# Patient Record
Sex: Male | Born: 1970 | Hispanic: Yes
Health system: Southern US, Community
[De-identification: ages and names within clinical notes are randomized; demographics above are authoritative.]

---

## 2021-02-18 ENCOUNTER — Emergency Department (HOSPITAL_COMMUNITY)
Admission: EM | Admit: 2021-02-18 | Discharge: 2021-02-19 | Discharge status: Against Medical Advice | Payer: BLUE CROSS/BLUE SHIELD | Attending: Emergency Medicine | Admitting: Emergency Medicine

## 2021-02-18 DIAGNOSIS — Z79899 Other long term (current) drug therapy: Secondary | ICD-10-CM | POA: Diagnosis not present

## 2021-02-18 DIAGNOSIS — R531 Weakness: Secondary | ICD-10-CM | POA: Insufficient documentation

## 2021-02-18 DIAGNOSIS — Y908 Blood alcohol level of 240 mg/100 ml or more: Secondary | ICD-10-CM | POA: Diagnosis not present

## 2021-02-18 DIAGNOSIS — F1010 Alcohol abuse, uncomplicated: Secondary | ICD-10-CM | POA: Insufficient documentation

## 2021-02-18 MED ORDER — SODIUM CHLORIDE 0.9 % IV BOLUS
1000.0000 mL | Freq: Once | INTRAVENOUS | Status: AC
  Administered 2021-02-19: 1000 mL via INTRAVENOUS
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
## 2021-02-18 NOTE — ED Notes (Signed)
This RN attempted to obtain height and weight on pt. Pt states he  Is 7'5" and 315lb.

## 2021-02-19 ENCOUNTER — Encounter (HOSPITAL_COMMUNITY): Payer: Self-pay | Admitting: Emergency Medicine

## 2021-02-19 LAB — COMPREHENSIVE METABOLIC PANEL
ALT: 33 U/L (ref 0–44)
AST: 106 U/L — ABNORMAL HIGH (ref 15–41)
Albumin: 2.1 g/dL — ABNORMAL LOW (ref 3.5–5.0)
Alkaline Phosphatase: 209 U/L — ABNORMAL HIGH (ref 38–126)
Anion gap: 12 (ref 5–15)
BUN: 5 mg/dL — ABNORMAL LOW (ref 6–20)
CO2: 22 mmol/L (ref 22–32)
Calcium: 7.7 mg/dL — ABNORMAL LOW (ref 8.9–10.3)
Chloride: 99 mmol/L (ref 98–111)
Creatinine, Ser: 0.75 mg/dL (ref 0.61–1.24)
GFR, Estimated: >60 mL/min (ref >60)
Glucose, Bld: 425 mg/dL — ABNORMAL HIGH (ref 70–99)
Potassium: 3.2 mmol/L — ABNORMAL LOW (ref 3.5–5.1)
Sodium: 133 mmol/L — ABNORMAL LOW (ref 135–145)
Total Bilirubin: 2 mg/dL — ABNORMAL HIGH (ref 0.3–1.2)
Total Protein: 7.7 g/dL (ref 6.5–8.1)

## 2021-02-19 LAB — CBC WITH DIFFERENTIAL/PLATELET
Abs Immature Granulocytes: 0.01 10*3/uL (ref 0.00–0.07)
Basophils Absolute: 0.1 10*3/uL (ref 0.0–0.1)
Basophils Relative: 3 %
Eosinophils Absolute: 0.2 10*3/uL (ref 0.0–0.5)
Eosinophils Relative: 5 %
HCT: 22.4 % — ABNORMAL LOW (ref 39.0–52.0)
Hemoglobin: 7.2 g/dL — ABNORMAL LOW (ref 13.0–17.0)
Immature Granulocytes: 0 %
Lymphocytes Relative: 24 %
Lymphs Abs: 1 10*3/uL (ref 0.7–4.0)
MCH: 27.5 pg (ref 26.0–34.0)
MCHC: 32.1 g/dL (ref 30.0–36.0)
MCV: 85.5 fL (ref 80.0–100.0)
Monocytes Absolute: 0.3 10*3/uL (ref 0.1–1.0)
Monocytes Relative: 8 %
Neutro Abs: 2.6 10*3/uL (ref 1.7–7.7)
Neutrophils Relative %: 60 %
Platelets: 18 10*3/uL — CL (ref 150–400)
RBC: 2.62 MIL/uL — ABNORMAL LOW (ref 4.22–5.81)
RDW: 18.6 % — ABNORMAL HIGH (ref 11.5–15.5)
Smear Review: DECREASED
WBC: 4.2 10*3/uL (ref 4.0–10.5)
nRBC: 0.5 % — ABNORMAL HIGH (ref 0.0–0.2)

## 2021-02-19 LAB — AMMONIA: Ammonia: 103 umol/L — ABNORMAL HIGH (ref 9–35)

## 2021-02-19 LAB — ETHANOL: Alcohol, Ethyl (B): 423 mg/dL (ref <10)

## 2021-02-19 NOTE — ED Notes (Signed)
Pt continuously stating he is leaving. 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 Dr Adela Lank made aware. IV taken out. Pt chaperoned down hallway to exit. Pt refused last set of vitals and signing AMA paperwork.

## 2021-02-19 NOTE — ED Provider Notes (Addendum)
[facility] EMERGENCY DEPARTMENT Provider Note   CSN: [id number] Arrival date & time: 02/18/21  2301     History Chief Complaint  Patient presents with   Weakness    Jeffrey Sullivan is a 50 y.o. male.  50 yo M with a chief complaints of left-sided weakness.  Tells me this been going on for a few days.  States that it is made him fall multiple times.  He feels like his left leg is much more weak than the other.  He denies head injury denies loss of consciousness denies chest pain.  Denies cough congestion or fever denies nausea vomiting or diarrhea.  The history is provided by the patient.  Weakness Severity:  Moderate Onset quality:  Gradual Duration:  3 days Timing:  Constant Progression:  Worsening Chronicity:  New Context: alcohol use   Relieved by:  Nothing Worsened by:  Nothing Ineffective treatments:  None tried Associated symptoms: no abdominal pain, no arthralgias, no chest pain, no diarrhea, no fever, no headaches, no myalgias, no shortness of breath and no vomiting       History reviewed. No pertinent past medical history.  There are no problems to display for this patient.   History reviewed. No pertinent surgical history.     No family history on file.     Home Medications Prior to Admission medications   Not on File    Allergies    Patient has no allergy information on record.  Review of Systems   Review of Systems  Constitutional:  Negative for chills and fever.  HENT:  Negative for congestion and facial swelling.   Eyes:  Negative for discharge and visual disturbance.  Respiratory:  Negative for shortness of breath.   Cardiovascular:  Negative for chest pain and palpitations.  Gastrointestinal:  Negative for abdominal pain, diarrhea and vomiting.  Musculoskeletal:  Negative for arthralgias and myalgias.  Skin:  Negative for color change and rash.  Neurological:  Positive for weakness. Negative for tremors, syncope and  headaches.  Psychiatric/Behavioral:  Negative for confusion and dysphoric mood.    Physical Exam Updated Vital Signs BP 105/72 (BP Location: Right Arm)   Pulse (!) 114   Temp 98.7 F (37.1 C) (Oral)   Resp 16   SpO2 99%   Physical Exam Vitals and nursing note reviewed.  Constitutional:      Appearance: He is well-developed.  HENT:     Head: Normocephalic and atraumatic.  Eyes:     Pupils: Pupils are equal, round, and reactive to light.  Neck:     Vascular: No JVD.  Cardiovascular:     Rate and Rhythm: Normal rate and regular rhythm.     Heart sounds: No murmur heard.   No friction rub. No gallop.  Pulmonary:     Effort: No respiratory distress.     Breath sounds: No wheezing.  Abdominal:     General: There is no distension.     Tenderness: There is no abdominal tenderness. There is no guarding or rebound.  Musculoskeletal:        General: Normal range of motion.     Cervical back: Normal range of motion and neck supple.  Skin:    Coloration: Skin is not pale.     Findings: No rash.  Neurological:     Mental Status: He is alert.     Comments: Positive EtOH on breath.  Patient does appear to have some weakness in the left side though may be  secondary to effort.  Intoxicated difficult neurologic exam.  Psychiatric:        Behavior: Behavior normal.    ED Results / Procedures / Treatments   Labs (all labs ordered are listed, but only abnormal results are displayed) Labs Reviewed  CBC WITH DIFFERENTIAL/PLATELET - Abnormal; Notable for the following components:      Result Value   RBC 2.62 (*)    Hemoglobin 7.2 (*)    HCT 22.4 (*)    RDW 18.6 (*)    Platelets 18 (*)    nRBC 0.5 (*)    All other components within normal limits  COMPREHENSIVE METABOLIC PANEL - Abnormal; Notable for the following components:   Sodium 133 (*)    Potassium 3.2 (*)    Glucose, Bld 425 (*)    BUN 5 (*)    Calcium 7.7 (*)    Albumin 2.1 (*)    AST 106 (*)    Alkaline Phosphatase  209 (*)    Total Bilirubin 2.0 (*)    All other components within normal limits  ETHANOL - Abnormal; Notable for the following components:   Alcohol, Ethyl (B) 423 (*)    All other components within normal limits  AMMONIA - Abnormal; Notable for the following components:   Ammonia 103 (*)    All other components within normal limits    EKG None  Radiology No results found.  Procedures Procedures   Medications Ordered in ED Medications  sodium chloride 0.9 % bolus 1,000 mL (0 mLs Intravenous Stopped 02/19/21 0141)    ED Course  I have reviewed the triage vital signs and the nursing notes.  Pertinent labs & imaging results that were available during my care of the patient were reviewed by me and considered in my medical decision making (see chart for details).    MDM Rules/Calculators/A&P                           50 yo M with a chief complaints of frequent falls and left-sided weakness going on for about 3 days now.  Patient is intoxicated and so is difficult to get a great history from him.  Will obtain a CT of the head blood work reassess.  The patient became tired of waiting.  He is able to ambulate under his own volition.  No obvious signs of leg weakness on ambulation.  Encouraged him to stay to complete the work-up the patient is declining.  Patient is able to ambulate's and make decisions when asked questions.  Do not feel that I can involuntarily hold him at this time.  Offered to return at any time.  The patients results and plan were reviewed and discussed.   Any x-rays performed were independently reviewed by myself.   Differential diagnosis were considered with the presenting HPI.  Medications  sodium chloride 0.9 % bolus 1,000 mL (0 mLs Intravenous Stopped 02/19/21 0141)    Vitals:   02/18/21 2321  BP: 105/72  Pulse: (!) 114  Resp: 16  Temp: 98.7 F (37.1 C)  TempSrc: Oral  SpO2: 99%    Final diagnoses:  Weakness    Final Clinical Impression(s)  / ED Diagnoses Final diagnoses:  Weakness    Rx / DC Orders ED Discharge Orders     None        Melene Plan, DO 02/19/21 0442    Melene Plan, DO 02/19/21 [phone]

## 2021-02-19 NOTE — ED Notes (Signed)
Pt is repeatedly stating he wants to leave, saying he doesn't want to wait any longer to be treated. Convinced to stay at this time

## 2021-08-30 ENCOUNTER — Other Ambulatory Visit: Payer: Self-pay

## 2021-08-30 ENCOUNTER — Emergency Department (HOSPITAL_COMMUNITY): Payer: Medicaid Other

## 2021-08-30 ENCOUNTER — Inpatient Hospital Stay (HOSPITAL_COMMUNITY)
Admission: EM | Admit: 2021-08-30 | Discharge: 2021-09-22 | DRG: 441 | Disposition: E | Payer: Medicaid Other | Attending: Pulmonary Disease | Admitting: Pulmonary Disease

## 2021-08-30 DIAGNOSIS — R188 Other ascites: Secondary | ICD-10-CM

## 2021-08-30 DIAGNOSIS — Z66 Do not resuscitate: Secondary | ICD-10-CM | POA: Diagnosis not present

## 2021-08-30 DIAGNOSIS — G9341 Metabolic encephalopathy: Secondary | ICD-10-CM | POA: Diagnosis present

## 2021-08-30 DIAGNOSIS — K567 Ileus, unspecified: Secondary | ICD-10-CM

## 2021-08-30 DIAGNOSIS — K7031 Alcoholic cirrhosis of liver with ascites: Secondary | ICD-10-CM | POA: Diagnosis present

## 2021-08-30 DIAGNOSIS — E8809 Other disorders of plasma-protein metabolism, not elsewhere classified: Secondary | ICD-10-CM

## 2021-08-30 DIAGNOSIS — Y908 Blood alcohol level of 240 mg/100 ml or more: Secondary | ICD-10-CM | POA: Diagnosis present

## 2021-08-30 DIAGNOSIS — J690 Pneumonitis due to inhalation of food and vomit: Secondary | ICD-10-CM | POA: Diagnosis not present

## 2021-08-30 DIAGNOSIS — J90 Pleural effusion, not elsewhere classified: Secondary | ICD-10-CM | POA: Diagnosis present

## 2021-08-30 DIAGNOSIS — K7460 Unspecified cirrhosis of liver: Secondary | ICD-10-CM

## 2021-08-30 DIAGNOSIS — E1165 Type 2 diabetes mellitus with hyperglycemia: Secondary | ICD-10-CM | POA: Diagnosis present

## 2021-08-30 DIAGNOSIS — F1510 Other stimulant abuse, uncomplicated: Secondary | ICD-10-CM | POA: Diagnosis present

## 2021-08-30 DIAGNOSIS — Z4659 Encounter for fitting and adjustment of other gastrointestinal appliance and device: Secondary | ICD-10-CM

## 2021-08-30 DIAGNOSIS — F10229 Alcohol dependence with intoxication, unspecified: Secondary | ICD-10-CM

## 2021-08-30 DIAGNOSIS — Z978 Presence of other specified devices: Secondary | ICD-10-CM

## 2021-08-30 DIAGNOSIS — R4182 Altered mental status, unspecified: Secondary | ICD-10-CM

## 2021-08-30 DIAGNOSIS — Z515 Encounter for palliative care: Secondary | ICD-10-CM

## 2021-08-30 DIAGNOSIS — R578 Other shock: Secondary | ICD-10-CM

## 2021-08-30 DIAGNOSIS — R791 Abnormal coagulation profile: Secondary | ICD-10-CM

## 2021-08-30 DIAGNOSIS — R34 Anuria and oliguria: Secondary | ICD-10-CM | POA: Diagnosis present

## 2021-08-30 DIAGNOSIS — R680 Hypothermia, not associated with low environmental temperature: Secondary | ICD-10-CM | POA: Diagnosis present

## 2021-08-30 DIAGNOSIS — K7682 Hepatic encephalopathy: Secondary | ICD-10-CM | POA: Diagnosis present

## 2021-08-30 DIAGNOSIS — R310 Gross hematuria: Secondary | ICD-10-CM | POA: Diagnosis present

## 2021-08-30 DIAGNOSIS — K7011 Alcoholic hepatitis with ascites: Secondary | ICD-10-CM | POA: Diagnosis present

## 2021-08-30 DIAGNOSIS — E8729 Other acidosis: Secondary | ICD-10-CM | POA: Diagnosis present

## 2021-08-30 DIAGNOSIS — D696 Thrombocytopenia, unspecified: Secondary | ICD-10-CM

## 2021-08-30 DIAGNOSIS — Z20822 Contact with and (suspected) exposure to covid-19: Secondary | ICD-10-CM | POA: Diagnosis present

## 2021-08-30 DIAGNOSIS — E876 Hypokalemia: Secondary | ICD-10-CM

## 2021-08-30 DIAGNOSIS — N179 Acute kidney failure, unspecified: Secondary | ICD-10-CM | POA: Diagnosis present

## 2021-08-30 DIAGNOSIS — G9340 Encephalopathy, unspecified: Secondary | ICD-10-CM

## 2021-08-30 DIAGNOSIS — E871 Hypo-osmolality and hyponatremia: Secondary | ICD-10-CM | POA: Diagnosis present

## 2021-08-30 DIAGNOSIS — J9601 Acute respiratory failure with hypoxia: Secondary | ICD-10-CM

## 2021-08-30 DIAGNOSIS — D62 Acute posthemorrhagic anemia: Secondary | ICD-10-CM | POA: Diagnosis present

## 2021-08-30 DIAGNOSIS — D689 Coagulation defect, unspecified: Secondary | ICD-10-CM | POA: Diagnosis present

## 2021-08-30 DIAGNOSIS — K7010 Alcoholic hepatitis without ascites: Secondary | ICD-10-CM

## 2021-08-30 DIAGNOSIS — E870 Hyperosmolality and hypernatremia: Secondary | ICD-10-CM

## 2021-08-30 DIAGNOSIS — Z452 Encounter for adjustment and management of vascular access device: Secondary | ICD-10-CM

## 2021-08-30 DIAGNOSIS — Z781 Physical restraint status: Secondary | ICD-10-CM

## 2021-08-30 DIAGNOSIS — Z9289 Personal history of other medical treatment: Secondary | ICD-10-CM

## 2021-08-30 DIAGNOSIS — E119 Type 2 diabetes mellitus without complications: Secondary | ICD-10-CM

## 2021-08-30 DIAGNOSIS — K3189 Other diseases of stomach and duodenum: Secondary | ICD-10-CM

## 2021-08-30 DIAGNOSIS — T50901A Poisoning by unspecified drugs, medicaments and biological substances, accidental (unintentional), initial encounter: Secondary | ICD-10-CM | POA: Diagnosis present

## 2021-08-30 DIAGNOSIS — F1010 Alcohol abuse, uncomplicated: Secondary | ICD-10-CM

## 2021-08-30 DIAGNOSIS — D6959 Other secondary thrombocytopenia: Secondary | ICD-10-CM | POA: Diagnosis present

## 2021-08-30 DIAGNOSIS — K922 Gastrointestinal hemorrhage, unspecified: Secondary | ICD-10-CM

## 2021-08-30 DIAGNOSIS — J9602 Acute respiratory failure with hypercapnia: Secondary | ICD-10-CM | POA: Diagnosis present

## 2021-08-30 DIAGNOSIS — E440 Moderate protein-calorie malnutrition: Secondary | ICD-10-CM | POA: Insufficient documentation

## 2021-08-30 DIAGNOSIS — R319 Hematuria, unspecified: Secondary | ICD-10-CM

## 2021-08-30 LAB — URINALYSIS, ROUTINE W REFLEX MICROSCOPIC
Bilirubin Urine: NEGATIVE
Glucose, UA: 50 mg/dL — AB
Ketones, ur: NEGATIVE mg/dL
Leukocytes,Ua: NEGATIVE
Nitrite: NEGATIVE
Protein, ur: 100 mg/dL — AB
RBC / HPF: >50 RBC/hpf — ABNORMAL HIGH (ref 0–5)
Specific Gravity, Urine: 1.011 (ref 1.005–1.030)
pH: 5 (ref 5.0–8.0)

## 2021-08-30 LAB — I-STAT CHEM 8, ED
BUN: 12 mg/dL (ref 6–20)
Calcium, Ion: 0.92 mmol/L — ABNORMAL LOW (ref 1.15–1.40)
Chloride: 98 mmol/L (ref 98–111)
Creatinine, Ser: 1.3 mg/dL — ABNORMAL HIGH (ref 0.61–1.24)
Glucose, Bld: 255 mg/dL — ABNORMAL HIGH (ref 70–99)
HCT: 32 % — ABNORMAL LOW (ref 39.0–52.0)
Hemoglobin: 10.9 g/dL — ABNORMAL LOW (ref 13.0–17.0)
Potassium: 3.4 mmol/L — ABNORMAL LOW (ref 3.5–5.1)
Sodium: 134 mmol/L — ABNORMAL LOW (ref 135–145)
TCO2: 22 mmol/L (ref 22–32)

## 2021-08-30 LAB — COMPREHENSIVE METABOLIC PANEL
ALT: 35 U/L (ref 0–44)
AST: 89 U/L — ABNORMAL HIGH (ref 15–41)
Albumin: 2.1 g/dL — ABNORMAL LOW (ref 3.5–5.0)
Alkaline Phosphatase: 248 U/L — ABNORMAL HIGH (ref 38–126)
Anion gap: 15 (ref 5–15)
BUN: 12 mg/dL (ref 6–20)
CO2: 17 mmol/L — ABNORMAL LOW (ref 22–32)
Calcium: 7.4 mg/dL — ABNORMAL LOW (ref 8.9–10.3)
Chloride: 98 mmol/L (ref 98–111)
Creatinine, Ser: 1.14 mg/dL (ref 0.61–1.24)
GFR, Estimated: >60 mL/min (ref >60)
Glucose, Bld: 265 mg/dL — ABNORMAL HIGH (ref 70–99)
Potassium: 3.4 mmol/L — ABNORMAL LOW (ref 3.5–5.1)
Sodium: 130 mmol/L — ABNORMAL LOW (ref 135–145)
Total Bilirubin: 2.2 mg/dL — ABNORMAL HIGH (ref 0.3–1.2)
Total Protein: 7.4 g/dL (ref 6.5–8.1)

## 2021-08-30 LAB — CK: Total CK: 431 U/L — ABNORMAL HIGH (ref 49–397)

## 2021-08-30 LAB — I-STAT ARTERIAL BLOOD GAS, ED
Acid-base deficit: 4 mmol/L — ABNORMAL HIGH (ref 0.0–2.0)
Bicarbonate: 22.1 mmol/L (ref 20.0–28.0)
Calcium, Ion: 1.02 mmol/L — ABNORMAL LOW (ref 1.15–1.40)
HCT: 28 % — ABNORMAL LOW (ref 39.0–52.0)
Hemoglobin: 9.5 g/dL — ABNORMAL LOW (ref 13.0–17.0)
O2 Saturation: 100 %
Patient temperature: 99.5
Potassium: 3.1 mmol/L — ABNORMAL LOW (ref 3.5–5.1)
Sodium: 137 mmol/L (ref 135–145)
TCO2: 23 mmol/L (ref 22–32)
pCO2 arterial: 44.6 mmHg (ref 32.0–48.0)
pH, Arterial: 7.305 — ABNORMAL LOW (ref 7.350–7.450)
pO2, Arterial: 285 mmHg — ABNORMAL HIGH (ref 83.0–108.0)

## 2021-08-30 LAB — TSH: TSH: 3.126 u[IU]/mL (ref 0.350–4.500)

## 2021-08-30 LAB — PROTIME-INR
INR: 2.5 — ABNORMAL HIGH (ref 0.8–1.2)
Prothrombin Time: 26.7 seconds — ABNORMAL HIGH (ref 11.4–15.2)

## 2021-08-30 LAB — CBC WITH DIFFERENTIAL/PLATELET
Abs Immature Granulocytes: 0.07 10*3/uL (ref 0.00–0.07)
Basophils Absolute: 0.1 10*3/uL (ref 0.0–0.1)
Basophils Relative: 2 %
Eosinophils Absolute: 0.2 10*3/uL (ref 0.0–0.5)
Eosinophils Relative: 3 %
HCT: 29.4 % — ABNORMAL LOW (ref 39.0–52.0)
Hemoglobin: 10.2 g/dL — ABNORMAL LOW (ref 13.0–17.0)
Immature Granulocytes: 1 %
Lymphocytes Relative: 19 %
Lymphs Abs: 1.2 10*3/uL (ref 0.7–4.0)
MCH: 32.2 pg (ref 26.0–34.0)
MCHC: 34.7 g/dL (ref 30.0–36.0)
MCV: 92.7 fL (ref 80.0–100.0)
Monocytes Absolute: 0.5 10*3/uL (ref 0.1–1.0)
Monocytes Relative: 8 %
Neutro Abs: 4.2 10*3/uL (ref 1.7–7.7)
Neutrophils Relative %: 67 %
Platelets: 103 10*3/uL — ABNORMAL LOW (ref 150–400)
RBC: 3.17 MIL/uL — ABNORMAL LOW (ref 4.22–5.81)
RDW: 15.7 % — ABNORMAL HIGH (ref 11.5–15.5)
WBC: 6.3 10*3/uL (ref 4.0–10.5)
nRBC: 0 % (ref 0.0–0.2)

## 2021-08-30 LAB — SALICYLATE LEVEL: Salicylate Lvl: <7 mg/dL — ABNORMAL LOW (ref 7.0–30.0)

## 2021-08-30 LAB — RESP PANEL BY RT-PCR (FLU A&B, COVID) ARPGX2
Influenza A by PCR: NEGATIVE
Influenza B by PCR: NEGATIVE
SARS Coronavirus 2 by RT PCR: NEGATIVE

## 2021-08-30 LAB — LACTIC ACID, PLASMA: Lactic Acid, Venous: 3.4 mmol/L (ref 0.5–1.9)

## 2021-08-30 LAB — I-STAT VENOUS BLOOD GAS, ED
Acid-base deficit: 2 mmol/L (ref 0.0–2.0)
Bicarbonate: 21.3 mmol/L (ref 20.0–28.0)
Calcium, Ion: 0.93 mmol/L — ABNORMAL LOW (ref 1.15–1.40)
HCT: 31 % — ABNORMAL LOW (ref 39.0–52.0)
Hemoglobin: 10.5 g/dL — ABNORMAL LOW (ref 13.0–17.0)
O2 Saturation: 99 %
Potassium: 3.4 mmol/L — ABNORMAL LOW (ref 3.5–5.1)
Sodium: 134 mmol/L — ABNORMAL LOW (ref 135–145)
TCO2: 22 mmol/L (ref 22–32)
pCO2, Ven: 30.1 mmHg — ABNORMAL LOW (ref 44.0–60.0)
pH, Ven: 7.457 — ABNORMAL HIGH (ref 7.250–7.430)
pO2, Ven: 141 mmHg — ABNORMAL HIGH (ref 32.0–45.0)

## 2021-08-30 LAB — ETHANOL: Alcohol, Ethyl (B): 280 mg/dL — ABNORMAL HIGH (ref <10)

## 2021-08-30 LAB — TROPONIN I (HIGH SENSITIVITY): Troponin I (High Sensitivity): 8 ng/L (ref <18)

## 2021-08-30 LAB — MAGNESIUM: Magnesium: 1.2 mg/dL — ABNORMAL LOW (ref 1.7–2.4)

## 2021-08-30 LAB — ACETAMINOPHEN LEVEL: Acetaminophen (Tylenol), Serum: <10 ug/mL — ABNORMAL LOW (ref 10–30)

## 2021-08-30 MED ORDER — PANTOPRAZOLE SODIUM 40 MG IV SOLR
40.0000 mg | Freq: Every day | INTRAVENOUS | Status: DC
  Administered 2021-08-31: 40 mg via INTRAVENOUS
  Filled 2021-08-30: qty 40

## 2021-08-30 MED ORDER — MAGNESIUM SULFATE 2 GM/50ML IV SOLN
4.0000 g | Freq: Once | INTRAVENOUS | Status: AC
  Administered 2021-08-31: 4 g via INTRAVENOUS
  Filled 2021-08-30: qty 100

## 2021-08-30 MED ORDER — DOCUSATE SODIUM 50 MG/5ML PO LIQD: 100.0000 mg | Freq: Two times a day (BID) | ORAL | Status: DC | PRN

## 2021-08-30 MED ORDER — LACTATED RINGERS IV SOLN: INTRAVENOUS | Status: AC

## 2021-08-30 MED ORDER — ROCURONIUM BROMIDE 50 MG/5ML IV SOLN
100.0000 mg | Freq: Once | INTRAVENOUS | Status: AC
  Administered 2021-08-30: 100 mg via INTRAVENOUS

## 2021-08-30 MED ORDER — SODIUM CHLORIDE 0.9 % IV BOLUS
1000.0000 mL | Freq: Once | INTRAVENOUS | Status: AC
  Administered 2021-08-30: 1000 mL via INTRAVENOUS

## 2021-08-30 MED ORDER — CALCIUM GLUCONATE-NACL 2-0.675 GM/100ML-% IV SOLN
2.0000 g | Freq: Once | INTRAVENOUS | Status: AC
  Administered 2021-08-31: 2000 mg via INTRAVENOUS
  Filled 2021-08-30: qty 100

## 2021-08-30 MED ORDER — NALOXONE HCL 2 MG/2ML IJ SOSY
1.0000 mg | PREFILLED_SYRINGE | Freq: Once | INTRAMUSCULAR | Status: AC
Start: 2021-08-30 — End: 2021-08-30
  Administered 2021-08-30: 1 mg via INTRAVENOUS

## 2021-08-30 MED ORDER — FENTANYL CITRATE PF 50 MCG/ML IJ SOSY
50.0000 ug | PREFILLED_SYRINGE | Freq: Once | INTRAMUSCULAR | Status: AC
  Administered 2021-08-30: 50 ug via INTRAVENOUS

## 2021-08-30 MED ORDER — SODIUM CHLORIDE 0.9 % IV BOLUS
500.0000 mL | Freq: Once | INTRAVENOUS | Status: AC
  Administered 2021-08-30: 500 mL via INTRAVENOUS

## 2021-08-30 MED ORDER — ACETAMINOPHEN 325 MG PO TABS: 650.0000 mg | ORAL_TABLET | ORAL | Status: DC | PRN

## 2021-08-30 MED ORDER — ETOMIDATE 2 MG/ML IV SOLN
20.0000 mg | Freq: Once | INTRAVENOUS | Status: AC
Start: 2021-08-30 — End: 2021-08-30
  Administered 2021-08-30: 20 mg via INTRAVENOUS

## 2021-08-30 MED ORDER — FENTANYL BOLUS VIA INFUSION
50.0000 ug | INTRAVENOUS | Status: DC | PRN
  Filled 2021-08-30: qty 50

## 2021-08-30 MED ORDER — POLYETHYLENE GLYCOL 3350 17 G PO PACK: 17.0000 g | PACK | Freq: Every day | ORAL | Status: DC | PRN

## 2021-08-30 MED ORDER — PROPOFOL 1000 MG/100ML IV EMUL
0.0000 ug/kg/min | INTRAVENOUS | Status: DC
  Administered 2021-08-30: 5 ug/kg/min via INTRAVENOUS
  Administered 2021-08-31: 15 ug/kg/min via INTRAVENOUS
  Filled 2021-08-30 (×2): qty 100

## 2021-08-30 MED ORDER — FENTANYL 2500MCG IN NS 250ML (10MCG/ML) PREMIX INFUSION
25.0000 ug/h | INTRAVENOUS | Status: DC
  Administered 2021-08-30: 50 ug/h via INTRAVENOUS
  Administered 2021-08-31 (×2): 300 ug/h via INTRAVENOUS
  Administered 2021-09-01: 400 ug/h via INTRAVENOUS
  Filled 2021-08-30 (×4): qty 250

## 2021-08-30 MED ORDER — HEPARIN SODIUM (PORCINE) 5000 UNIT/ML IJ SOLN
5000.0000 [IU] | Freq: Three times a day (TID) | INTRAMUSCULAR | Status: DC
  Administered 2021-08-31: 5000 [IU] via SUBCUTANEOUS
  Filled 2021-08-30: qty 1

## 2021-08-30 MED ORDER — ONDANSETRON HCL 4 MG/2ML IJ SOLN
4.0000 mg | Freq: Four times a day (QID) | INTRAMUSCULAR | Status: DC | PRN
  Administered 2021-09-03: 4 mg via INTRAVENOUS
  Filled 2021-08-30 (×2): qty 2

## 2021-08-30 NOTE — ED Triage Notes (Signed)
Pt brought in by EMS for a drug overdose. Per crew, pt was found unresponsive. Per family, pt regularly uses meth and drinks alcohol.

## 2021-08-30 NOTE — ED Provider Notes (Signed)
[facility]-EMERGENCY DEPT [facility] Emergency Department Provider Note MRN:  [id number]  Arrival date & time: 08/31/21     Chief Complaint   Drug Overdose   History of Present Illness   Jeffrey Sullivan is a 51 y.o. year-old male with a history of EtOH abuse and meth abuse presenting to the ED with altered mental status.  EMS brought patient to our emergency department.  EMS reports the patient was found unresponsive.  Per family members the patient regularly uses meth and drinks alcohol.  EMS suspected drug overdose.  Narcan was given in the field.  No response noted.  No further history able to be obtained given that the patient has a GCS of 3.  Review of Systems  Unable to obtain secondary to altered mental status.  Patient's Health History   No past medical history on file.  No past surgical history on file.  No family history on file.  Social History   Socioeconomic History   Marital status: Divorced    Spouse name: Not on file   Number of children: Not on file   Years of education: Not on file   Highest education level: Not on file  Occupational History   Not on file  Tobacco Use   Smoking status: Not on file   Smokeless tobacco: Not on file  Substance and Sexual Activity   Alcohol use: Not on file   Drug use: Not on file   Sexual activity: Not on file  Other Topics Concern   Not on file  Social History Narrative   Not on file   Social Determinants of Health   Financial Resource Strain: Not on file  Food Insecurity: Not on file  Transportation Needs: Not on file  Physical Activity: Not on file  Stress: Not on file  Social Connections: Not on file  Intimate Partner Violence: Not on file     Physical Exam   Physical Exam Vitals and nursing note reviewed.  Constitutional:      General: He is in acute distress.     Appearance: He is ill-appearing and diaphoretic.     Comments: Patient unresponsive.  GCS of 3.  The patient had defecated on himself.   Patient feels warm to the touch.  HENT:     Head: Normocephalic and atraumatic.  Eyes:     Comments: Pupils 4 mm round reactive to light  Cardiovascular:     Rate and Rhythm: Regular rhythm. Tachycardia present.  Pulmonary:     Breath sounds: Rhonchi present.  Abdominal:     General: Abdomen is flat.     Palpations: Abdomen is soft.  Genitourinary:    Penis: Normal.      Testes: Normal.  Musculoskeletal:     Cervical back: Neck supple.  Skin:    Comments: Lateral lower extremity skin changes  Neurological:     GCS: GCS eye subscore is 1. GCS verbal subscore is 1. GCS motor subscore is 1.      Diagnostic and Interventional Summary    EKG Interpretation  Date/Time:  Monday 29-Sep-2021 20:42:19 EST Ventricular Rate:  146 PR Interval:  113 QRS Duration: 85 QT Interval:  287 QTC Calculation: 448 R Axis:   86 Text Interpretation: Sinus tachycardia Borderline repolarization abnormality possibly rate-related No old tracing to compare Confirmed by Dione Booze ([id number]) on 2021/09/29 11:44:25 PM       Labs Reviewed  ETHANOL - Abnormal; Notable for the following components:      Result  Value   Alcohol, Ethyl (B) 280 (*)    All other components within normal limits  COMPREHENSIVE METABOLIC PANEL - Abnormal; Notable for the following components:   Sodium 130 (*)    Potassium 3.4 (*)    CO2 17 (*)    Glucose, Bld 265 (*)    Calcium 7.4 (*)    Albumin 2.1 (*)    AST 89 (*)    Alkaline Phosphatase 248 (*)    Total Bilirubin 2.2 (*)    All other components within normal limits  SALICYLATE LEVEL - Abnormal; Notable for the following components:   Salicylate Lvl <7.0 (*)    All other components within normal limits  LACTIC ACID, PLASMA - Abnormal; Notable for the following components:   Lactic Acid, Venous 3.4 (*)    All other components within normal limits  LACTIC ACID, PLASMA - Abnormal; Notable for the following components:   Lactic Acid, Venous 3.4 (*)    All other  components within normal limits  CBC WITH DIFFERENTIAL/PLATELET - Abnormal; Notable for the following components:   RBC 3.17 (*)    Hemoglobin 10.2 (*)    HCT 29.4 (*)    RDW 15.7 (*)    Platelets 103 (*)    All other components within normal limits  ACETAMINOPHEN LEVEL - Abnormal; Notable for the following components:   Acetaminophen (Tylenol), Serum <10 (*)    All other components within normal limits  URINALYSIS, ROUTINE W REFLEX MICROSCOPIC - Abnormal; Notable for the following components:   Color, Urine AMBER (*)    APPearance HAZY (*)    Glucose, UA 50 (*)    Hgb urine dipstick LARGE (*)    Protein, ur 100 (*)    RBC / HPF >50 (*)    Bacteria, UA RARE (*)    All other components within normal limits  RAPID URINE DRUG SCREEN, HOSP PERFORMED - Abnormal; Notable for the following components:   Opiates NEGATIVE (*)    Cocaine NEGATIVE (*)    Benzodiazepines NEGATIVE (*)    Amphetamines NEGATIVE (*)    Tetrahydrocannabinol NEGATIVE (*)    Barbiturates NEGATIVE (*)    All other components within normal limits  MAGNESIUM - Abnormal; Notable for the following components:   Magnesium 1.2 (*)    All other components within normal limits  CK - Abnormal; Notable for the following components:   Total CK 431 (*)    All other components within normal limits  PROTIME-INR - Abnormal; Notable for the following components:   Prothrombin Time 26.7 (*)    INR 2.5 (*)    All other components within normal limits  I-STAT VENOUS BLOOD GAS, ED - Abnormal; Notable for the following components:   pH, Ven 7.457 (*)    pCO2, Ven 30.1 (*)    pO2, Ven 141.0 (*)    Sodium 134 (*)    Potassium 3.4 (*)    Calcium, Ion 0.93 (*)    HCT 31.0 (*)    Hemoglobin 10.5 (*)    All other components within normal limits  I-STAT CHEM 8, ED - Abnormal; Notable for the following components:   Sodium 134 (*)    Potassium 3.4 (*)    Creatinine, Ser 1.30 (*)    Glucose, Bld 255 (*)    Calcium, Ion 0.92 (*)     Hemoglobin 10.9 (*)    HCT 32.0 (*)    All other components within normal limits  I-STAT ARTERIAL BLOOD GAS, ED -  Abnormal; Notable for the following components:   pH, Arterial 7.305 (*)    pO2, Arterial 285 (*)    Acid-base deficit 4.0 (*)    Potassium 3.1 (*)    Calcium, Ion 1.02 (*)    HCT 28.0 (*)    Hemoglobin 9.5 (*)    All other components within normal limits  TROPONIN I (HIGH SENSITIVITY) - Abnormal; Notable for the following components:   Troponin I (High Sensitivity) 26 (*)    All other components within normal limits  RESP PANEL BY RT-PCR (FLU A&B, COVID) ARPGX2  TSH  TRIGLYCERIDES  BLOOD GAS, ARTERIAL  PROTIME-INR  HIV ANTIBODY (ROUTINE TESTING W REFLEX)  CBC  MAGNESIUM  BASIC METABOLIC PANEL  HEPATITIS PANEL, ACUTE  TYPE AND SCREEN  ABO/RH  TROPONIN I (HIGH SENSITIVITY)    CT Head Wo Contrast  Final Result    CT Cervical Spine Wo Contrast  Final Result    DG Chest Portable 1 View  Final Result    US Abdomen Limited RUQ (LIVER/GB)    (Results Pending)    Medications  fentaNYL in NS (67mcg/ml) infusion-PREMIX (100 mcg/hr Intravenous Rate/Dose Change 08/31/21 0004)  fentaNYL (SUBLIMAZE) bolus via infusion 50 mcg (has no administration in time range)  propofol (DIPRIVAN) 1000 MG/100ML infusion (25 mcg/kg/min  90.7 kg Intravenous Rate/Dose Change 08/31/21 0005)  heparin injection 5,000 Units (5,000 Units Subcutaneous Given 08/31/21 0021)  lactated ringers infusion (has no administration in time range)  docusate (COLACE) 50 MG/5ML liquid 100 mg (has no administration in time range)  polyethylene glycol (MIRALAX / GLYCOLAX) packet 17 g (has no administration in time range)  ondansetron (ZOFRAN) injection 4 mg (has no administration in time range)  magnesium sulfate IVPB 4 g 100 mL (has no administration in time range)  calcium gluconate 2 g/ 100 mL sodium chloride IVPB (has no administration in time range)  lactated ringers bolus 1,000 mL (1,000  mLs Intravenous New Bag/Given 08/31/21 0029)    Followed by  lactated ringers bolus 1,000 mL (has no administration in time range)  Ampicillin-Sulbactam (UNASYN) 3 g in sodium chloride 0.9 % 100 mL IVPB (3 g Intravenous New Bag/Given 08/31/21 0036)  pantoprazole (PROTONIX) 80 mg /NS 100 mL IVPB (has no administration in time range)  pantoprozole (PROTONIX) 80 mg /NS 100 mL infusion (has no administration in time range)  pantoprazole (PROTONIX) injection 40 mg (has no administration in time range)  octreotide (SANDOSTATIN) 500 mcg in sodium chloride 0.9 % 250 mL (2 mcg/mL) infusion (has no administration in time range)  folic acid 1 mg in sodium chloride 0.9 % 50 mL IVPB (has no administration in time range)  thiamine (B-1) injection 100 mg (has no administration in time range)  multivitamin liquid 15 mL (has no administration in time range)  fentaNYL (SUBLIMAZE) injection 50 mcg (50 mcg Intravenous Given 08/26/2021 2045)  sodium chloride 0.9 % bolus 500 mL (0 mLs Intravenous Stopped 09/18/2021 2216)  sodium chloride 0.9 % bolus 1,000 mL (0 mLs Intravenous Stopped 09/06/2021 2116)  naloxone (NARCAN) injection 1 mg (1 mg Intravenous Given 09/21/2021 2031)  etomidate (AMIDATE) injection 20 mg (20 mg Intravenous Given 09/02/2021 2034)  rocuronium (ZEMURON) injection 100 mg (100 mg Intravenous Given 08/31/2021 2035)     Procedures  /  Critical Care Procedures  ED Course and Medical Decision Making  Initial Impression and Ddx 51 year old male presents to the emergency department unresponsive.  Differential diagnosis includes but is not limited to the following: Drug overdose,  thyrotoxicosis, NMS, serotonin syndrome, rhabdomyolysis, ACS, intracranial hemorrhage.  Past medical/surgical history that increases complexity of ED encounter: Substance abuse  Interpretation of Diagnostics I personally reviewed the EKG, Chest Xray, and Cardiac Monitor and my interpretation is as follows: Chest x-ray with an appropriately  positioned ET tube.  Also appears to have evidence of aspiration.  Cardiac monitor reveals that the patient is tachycardic.  EKG reveals that the patient is tachycardic with borderline repolarization abnormalities.    Patient was noted to have a lactic acidosis of 3.4.  VBG reveals a pH of 7.3 with a PCO2 of 44.6.  I-STAT Chem-8 reveals a creatinine of 1.30 with up glucose of 255.  CK4 31.  EtOH elevated.  CBC with mild anemia and thrombocytopenia.  Patient Reassessment and Ultimate Disposition/Management Discussed the case with the on-call intensivist who agreed admit the patient to the ICU for further management of his care.  Patient management required discussion with the following services or consulting groups:  Intensivist Service  Complexity of Problems Addressed Acute illness or injury that poses threat of life of bodily function  Additional Data Reviewed and Analyzed Further history obtained from: EMS on arrival  Factors Impacting ED Encounter Risk Consideration of hospitalization    Final Clinical Impressions(s) / ED Diagnoses     ICD-10-CM   1. Altered mental status, unspecified altered mental status type  R41.82     2. Cirrhosis (HCC)  K74.60 US Abdomen Limited RUQ (LIVER/GB)    US Abdomen Limited RUQ (LIVER/GB)    CANCELED: US Abdomen Limited RUQ (LIVER/GB)    CANCELED: US Abdomen Limited RUQ (LIVER/GB)           Camila LiMcIlwain, Jarmel Linhardt S, MD 08/31/21 [place] [id number]    Blane OharaZavitz, Joshua, MD 09/04/21 [phone]

## 2021-08-30 NOTE — ED Notes (Signed)
Pt starting to moves arm and trying to grab et tube. Soft restraints placed on pt. Can't increase medications anymore due to drop in blood pressure. Dr. Reather Converse made aware. No new orders received.

## 2021-08-30 NOTE — ED Notes (Signed)
Pt intubated Meds 1 mg narcan at 2031 20 mg etomidate at 2034 100 mg Roc at 2035

## 2021-08-30 NOTE — H&P (Addendum)
NAME:  Jeffrey Sullivan, MRN:  [id number], DOB:  08/02/1970, LOS: 0 ADMISSION DATE:  08/27/2021, CONSULTATION DATE:  09/21/2021 REFERRING MD:  Judith Blonder - EM , CHIEF COMPLAINT:  Acute metabolic encephalopathy    History of Present Illness:  51 yo M PMH polysubstance abuse including EtOH abuse and meth abuse presented to [facility] ED via EMS with AMS after being found down, concerning for possible drug overdose. Pt was intubated for airway protection and limited history is available in the chart for review  CT H without acute intracranial process  Labs Na 130, K 3.4, CO2 17, Glu 265, Mag 1.2 Alk Phos 248 AST 89 ALT 35 Albumin 2.1  iCal 0.92   Hgb 10.2 HCT 29 plt 103   Ethyl alcohol 280 UDS negative   Pertinent  Medical History  Substance use disorder  Significant Hospital Events: Including procedures, antibiotic start and stop dates in addition to other pertinent events   2/7 found down OOH, BIB EMS as suspected OD. Intubated in ED. Admitting to ICU   Interim History / Subjective:  15 prop 50 fent, waking up and had disconnecting from vent tubing   Reconnected to vent. Suctioned.  25mg bolus fent given.  Fent gtt incr to 1024m Restraints adjusted  Objective   Blood pressure 132/76, pulse (!) 143, temperature 100 F (37.8 C), temperature source Core (Comment), resp. rate 16, height 5' 8" (1.727 m), weight 90.7 kg, SpO2 100 %.    Vent Mode: PRVC FiO2 (%):  [100 %] 100 % Set Rate:  [16 bmp] 16 bmp Vt Set:  [550 mL] 550 mL PEEP:  [5 cmH20] 5 cmH20 Plateau Pressure:  [15 cmH20] 15 cmH20  No intake or output data in the 24 hours ending 09/21/2021 2311 Filed Weights   08/31/2021 2033  Weight: 90.7 kg    Examination: General: Critically ill appearing middle aged M intubated lightly sedated NAD, smells of alcohol  HENT: NCAT ETT secure with moderate thick tan secretions Trachea midline  Lungs: Coarse bilaterally. RUL rhonchi mechanically ventilated  Cardiovascular: tachycardic. S1s2 no rgm   Abdomen: soft ndnt + bowel sounds. Bloody output from OGT  Extremities: no acute joint deformity. No cyanosis or clubbing  Neuro: Awakens spontaneously moving BUE BLE against gravity. Does not follow commands. PERRLA GU: foley, yellow urine  Skin: scattered ecchymosis.   Resolved Hospital Problem list     Assessment & Plan:   Acute encephalopathy EtOH abuse with acute alcohol intoxication  Hx polysubstance abuse -UDS neg.  -greater suspicion that current presentation is related to etoh intoxication vs drug OD, though cant r/o drugs that would not capture on UDS  P -Prop, fent while intubated.  -after extubation, will likely need CIWA / BZDs  -TOC consult when appropriate   Acute respiratory failure requiring intubation Small right sided pleural effusion  Suspected aspiration  P -full MV support -WUA/SBT in AM -prop, PRN fent for sedation  -VAP, pulm hygiene  -empiric unasyn   GIB -bloody OGT output P -trend H/H -protonix gtt -octreotide (?varices) -type & screen  -consider GI consult   Coagulopathy Elevated LFTs -with hx EtOH use, most concerning for etoh related liver disease -not on home anticoag  P -acute hepatitis panel -trend LFTs, INR -RUQ [place] NAGMA  Lactic acidosis Elevated CK  P -IVF -repeat CK, LA   Hypocalcemia Hypomagnesemia Hyponatremia, mild - ? Chronic  Hypokalemia P -4g mag  -2g Ca  -40 mEq K Anemia Thrombocytopenia  -suspect chronic component P -  trend  -consider iron studies  Malnutrition  P -start B Vits, Multivit  -if GIB slows, RDN consult for EN   Best Practice (right click and "Reselect all SmartList Selections" daily)   Diet/type: NPO DVT prophylaxis: SCD GI prophylaxis: PPI Lines: N/A Foley:  Yes, and it is still needed Code Status:  full code Last date of multidisciplinary goals of care discussion [unable to reach family]  Labs   CBC: Recent Labs  Lab 08/29/2021 2045 09/21/2021 2103 09/01/2021 2131  WBC  6.3  --   --   NEUTROABS 4.2  --   --   HGB 10.2* 10.9*   10.5* 9.5*  HCT 29.4* 32.0*   31.0* 28.0*  MCV 92.7  --   --   PLT 103*  --   --     Basic Metabolic Panel: Recent Labs  Lab 09/13/2021 2045 09/04/2021 2103 09/21/2021 2131  NA 130* 134*   134* 137  K 3.4* 3.4*   3.4* 3.1*  CL 98 98  --   CO2 17*  --   --   GLUCOSE 265* 255*  --   BUN 12 12  --   CREATININE 1.14 1.30*  --   CALCIUM 7.4*  --   --   MG 1.2*  --   --    GFR: Estimated Creatinine Clearance: 74.3 mL/min (A) (by C-G formula based on SCr of 1.3 mg/dL (H)). Recent Labs  Lab 09/05/2021 2045  WBC 6.3  LATICACIDVEN 3.4*    Liver Function Tests: Recent Labs  Lab 08/31/2021 2045  AST 89*  ALT 35  ALKPHOS 248*  BILITOT 2.2*  PROT 7.4  ALBUMIN 2.1*   No results for input(s): LIPASE, AMYLASE in the last 168 hours. No results for input(s): AMMONIA in the last 168 hours.  ABG    Component Value Date/Time   PHART 7.305 (L) 09/09/2021 2131   PCO2ART 44.6 09/17/2021 2131   PO2ART 285 (H) 09/13/2021 2131   HCO3 22.1 08/29/2021 2131   TCO2 23 09/01/2021 2131   ACIDBASEDEF 4.0 (H) 08/27/2021 2131   O2SAT 100.0 09/19/2021 2131     Coagulation Profile: Recent Labs  Lab 09/15/2021 2045  INR 2.5*    Cardiac Enzymes: Recent Labs  Lab 09/05/2021 2045  CKTOTAL 431*    HbA1C: No results found for: HGBA1C  CBG: No results for input(s): GLUCAP in the last 168 hours.  Review of Systems:   Unable to obtain, intubated   Past Medical History:  He,  has no past medical history on file.   Surgical History:  No past surgical history on file.   Social History:      Family History:  His family history is not on file.   Allergies Not on File   Home Medications  Prior to Admission medications   Not on File     Critical care time: 50 min     CRITICAL CARE Performed by: Cristal Generous   Total critical care time: 50 minutes  Critical care time was exclusive of separately billable procedures  and treating other patients.  Critical care was necessary to treat or prevent imminent or life-threatening deterioration.  Critical care was time spent personally by me on the following activities: development of treatment plan with patient and/or surrogate as well as nursing, discussions with consultants, evaluation of patient's response to treatment, examination of patient, obtaining history from patient or surrogate, ordering and performing treatments and interventions, ordering and review of laboratory studies, ordering and  review of radiographic studies, pulse oximetry and re-evaluation of patient's condition.  Eliseo Gum MSN, AGACNP-BC [place] for pager  08/31/2021, 12:38 AM

## 2021-08-31 ENCOUNTER — Inpatient Hospital Stay (HOSPITAL_COMMUNITY): Payer: Medicaid Other

## 2021-08-31 DIAGNOSIS — K920 Hematemesis: Secondary | ICD-10-CM

## 2021-08-31 DIAGNOSIS — F10921 Alcohol use, unspecified with intoxication delirium: Secondary | ICD-10-CM

## 2021-08-31 DIAGNOSIS — J9601 Acute respiratory failure with hypoxia: Secondary | ICD-10-CM

## 2021-08-31 LAB — GLUCOSE, CAPILLARY
Glucose-Capillary: 174 mg/dL — ABNORMAL HIGH (ref 70–99)
Glucose-Capillary: 175 mg/dL — ABNORMAL HIGH (ref 70–99)
Glucose-Capillary: 178 mg/dL — ABNORMAL HIGH (ref 70–99)
Glucose-Capillary: 181 mg/dL — ABNORMAL HIGH (ref 70–99)
Glucose-Capillary: 190 mg/dL — ABNORMAL HIGH (ref 70–99)

## 2021-08-31 LAB — CBC
HCT: 20.3 % — ABNORMAL LOW (ref 39.0–52.0)
HCT: 24.1 % — ABNORMAL LOW (ref 39.0–52.0)
Hemoglobin: 6.6 g/dL — CL (ref 13.0–17.0)
Hemoglobin: 8 g/dL — ABNORMAL LOW (ref 13.0–17.0)
MCH: 31.4 pg (ref 26.0–34.0)
MCH: 31.4 pg (ref 26.0–34.0)
MCHC: 32.5 g/dL (ref 30.0–36.0)
MCHC: 33.2 g/dL (ref 30.0–36.0)
MCV: 96.7 fL (ref 80.0–100.0)
MCV: 94.5 fL (ref 80.0–100.0)
Platelets: UNDETERMINED 10*3/uL (ref 150–400)
Platelets: 50 10*3/uL — ABNORMAL LOW (ref 150–400)
RBC: 2.1 MIL/uL — ABNORMAL LOW (ref 4.22–5.81)
RBC: 2.55 MIL/uL — ABNORMAL LOW (ref 4.22–5.81)
RDW: 15.9 % — ABNORMAL HIGH (ref 11.5–15.5)
RDW: 15.9 % — ABNORMAL HIGH (ref 11.5–15.5)
WBC: 3.8 10*3/uL — ABNORMAL LOW (ref 4.0–10.5)
WBC: 6.5 10*3/uL (ref 4.0–10.5)
nRBC: 0 % (ref 0.0–0.2)
nRBC: 0 % (ref 0.0–0.2)

## 2021-08-31 LAB — RAPID URINE DRUG SCREEN, HOSP PERFORMED
Amphetamines: NOT DETECTED
Barbiturates: NOT DETECTED
Benzodiazepines: NOT DETECTED
Cocaine: NOT DETECTED
Opiates: NOT DETECTED
Tetrahydrocannabinol: NOT DETECTED

## 2021-08-31 LAB — COMPREHENSIVE METABOLIC PANEL
ALT: 32 U/L (ref 0–44)
AST: 71 U/L — ABNORMAL HIGH (ref 15–41)
Albumin: 1.9 g/dL — ABNORMAL LOW (ref 3.5–5.0)
Alkaline Phosphatase: 163 U/L — ABNORMAL HIGH (ref 38–126)
Anion gap: 19 — ABNORMAL HIGH (ref 5–15)
BUN: 11 mg/dL (ref 6–20)
CO2: 17 mmol/L — ABNORMAL LOW (ref 22–32)
Calcium: 7.3 mg/dL — ABNORMAL LOW (ref 8.9–10.3)
Chloride: 101 mmol/L (ref 98–111)
Creatinine, Ser: 1.09 mg/dL (ref 0.61–1.24)
GFR, Estimated: >60 mL/min (ref >60)
Glucose, Bld: 260 mg/dL — ABNORMAL HIGH (ref 70–99)
Potassium: 3.3 mmol/L — ABNORMAL LOW (ref 3.5–5.1)
Sodium: 137 mmol/L (ref 135–145)
Total Bilirubin: 2.2 mg/dL — ABNORMAL HIGH (ref 0.3–1.2)
Total Protein: 5.9 g/dL — ABNORMAL LOW (ref 6.5–8.1)

## 2021-08-31 LAB — HEMOGLOBIN A1C
Hgb A1c MFr Bld: 7 % — ABNORMAL HIGH (ref 4.8–5.6)
Mean Plasma Glucose: 154.2 mg/dL

## 2021-08-31 LAB — HEPATITIS PANEL, ACUTE
HCV Ab: NONREACTIVE
Hep A IgM: NONREACTIVE
Hep B C IgM: NONREACTIVE
Hepatitis B Surface Ag: NONREACTIVE

## 2021-08-31 LAB — LACTIC ACID, PLASMA
Lactic Acid, Venous: 3.4 mmol/L (ref 0.5–1.9)
Lactic Acid, Venous: 4.9 mmol/L (ref 0.5–1.9)
Lactic Acid, Venous: 5.7 mmol/L (ref 0.5–1.9)

## 2021-08-31 LAB — IRON AND TIBC
Iron: 62 ug/dL (ref 45–182)
Saturation Ratios: 29 % (ref 17.9–39.5)
TIBC: 213 ug/dL — ABNORMAL LOW (ref 250–450)
UIBC: 151 ug/dL

## 2021-08-31 LAB — AMMONIA: Ammonia: 89 umol/L — ABNORMAL HIGH (ref 9–35)

## 2021-08-31 LAB — HIV ANTIBODY (ROUTINE TESTING W REFLEX): HIV Screen 4th Generation wRfx: NONREACTIVE

## 2021-08-31 LAB — PROTIME-INR
INR: 1.6 — ABNORMAL HIGH (ref 0.8–1.2)
Prothrombin Time: 18.8 seconds — ABNORMAL HIGH (ref 11.4–15.2)

## 2021-08-31 LAB — TROPONIN I (HIGH SENSITIVITY): Troponin I (High Sensitivity): 26 ng/L — ABNORMAL HIGH (ref <18)

## 2021-08-31 LAB — CK: Total CK: 275 U/L (ref 49–397)

## 2021-08-31 LAB — PREPARE RBC (CROSSMATCH)

## 2021-08-31 LAB — MAGNESIUM: Magnesium: 1.3 mg/dL — ABNORMAL LOW (ref 1.7–2.4)

## 2021-08-31 LAB — FERRITIN: Ferritin: 40 ng/mL (ref 24–336)

## 2021-08-31 LAB — ABO/RH: ABO/RH(D): B POS

## 2021-08-31 LAB — MRSA NEXT GEN BY PCR, NASAL: MRSA by PCR Next Gen: DETECTED — AB

## 2021-08-31 LAB — TRIGLYCERIDES: Triglycerides: 87 mg/dL (ref <150)

## 2021-08-31 MED ORDER — SODIUM CHLORIDE 0.9 % IV SOLN
50.0000 ug/h | INTRAVENOUS | Status: DC
  Administered 2021-08-31 – 2021-09-02 (×7): 50 ug/h via INTRAVENOUS
  Filled 2021-08-31 (×7): qty 1

## 2021-08-31 MED ORDER — NOREPINEPHRINE 4 MG/250ML-% IV SOLN
2.0000 ug/min | INTRAVENOUS | Status: DC
  Administered 2021-08-31: 2.5 ug/min via INTRAVENOUS
  Administered 2021-09-01: 2 ug/min via INTRAVENOUS
  Filled 2021-08-31 (×3): qty 250

## 2021-08-31 MED ORDER — PANTOPRAZOLE SODIUM 40 MG IV SOLR: 40.0000 mg | Freq: Two times a day (BID) | INTRAVENOUS | Status: DC

## 2021-08-31 MED ORDER — LACTATED RINGERS IV BOLUS
1000.0000 mL | Freq: Once | INTRAVENOUS | Status: AC
  Administered 2021-08-31: 1000 mL via INTRAVENOUS

## 2021-08-31 MED ORDER — ADULT MULTIVITAMIN LIQUID CH
15.0000 mL | Freq: Every day | ORAL | Status: DC
  Administered 2021-09-01: 15 mL
  Filled 2021-08-31: qty 15

## 2021-08-31 MED ORDER — DOCUSATE SODIUM 50 MG/5ML PO LIQD
100.0000 mg | Freq: Two times a day (BID) | ORAL | Status: DC
  Administered 2021-08-31 – 2021-09-01 (×4): 100 mg
  Filled 2021-08-31 (×4): qty 10

## 2021-08-31 MED ORDER — CHLORHEXIDINE GLUCONATE CLOTH 2 % EX PADS
6.0000 | MEDICATED_PAD | Freq: Every day | CUTANEOUS | Status: DC
  Administered 2021-08-31 – 2021-09-02 (×3): 6 via TOPICAL

## 2021-08-31 MED ORDER — SODIUM CHLORIDE 0.9% IV SOLUTION: Freq: Once | INTRAVENOUS | Status: AC

## 2021-08-31 MED ORDER — SODIUM CHLORIDE 0.9 % IV SOLN
1.0000 mg | Freq: Once | INTRAVENOUS | Status: AC
  Administered 2021-08-31: 1 mg via INTRAVENOUS
  Filled 2021-08-31: qty 0.2

## 2021-08-31 MED ORDER — ORAL CARE MOUTH RINSE
15.0000 mL | OROMUCOSAL | Status: DC
  Administered 2021-08-31 – 2021-09-03 (×30): 15 mL via OROMUCOSAL

## 2021-08-31 MED ORDER — MUPIROCIN 2 % EX OINT
1.0000 "application " | TOPICAL_OINTMENT | Freq: Two times a day (BID) | CUTANEOUS | Status: DC
  Administered 2021-08-31 – 2021-09-02 (×6): 1 via NASAL
  Filled 2021-08-31: qty 22

## 2021-08-31 MED ORDER — LACTULOSE 10 GM/15ML PO SOLN
30.0000 g | Freq: Four times a day (QID) | ORAL | Status: DC
  Administered 2021-08-31 – 2021-09-01 (×4): 30 g
  Filled 2021-08-31 (×4): qty 45

## 2021-08-31 MED ORDER — SODIUM CHLORIDE 0.9% IV SOLUTION: Freq: Once | INTRAVENOUS | Status: DC

## 2021-08-31 MED ORDER — POLYETHYLENE GLYCOL 3350 17 G PO PACK
17.0000 g | PACK | Freq: Every day | ORAL | Status: DC
  Administered 2021-08-31 – 2021-09-01 (×2): 17 g
  Filled 2021-08-31 (×2): qty 1

## 2021-08-31 MED ORDER — MIDAZOLAM-SODIUM CHLORIDE 100-0.9 MG/100ML-% IV SOLN
0.5000 mg/h | INTRAVENOUS | Status: DC
  Administered 2021-08-31: 0.5 mg/h via INTRAVENOUS
  Filled 2021-08-31: qty 100

## 2021-08-31 MED ORDER — POTASSIUM CHLORIDE 10 MEQ/100ML IV SOLN
10.0000 meq | INTRAVENOUS | Status: AC
  Administered 2021-08-31 (×4): 10 meq via INTRAVENOUS
  Filled 2021-08-31 (×4): qty 100

## 2021-08-31 MED ORDER — ALBUMIN HUMAN 25 % IV SOLN
25.0000 g | Freq: Four times a day (QID) | INTRAVENOUS | Status: AC
  Administered 2021-08-31 – 2021-09-02 (×8): 25 g via INTRAVENOUS
  Filled 2021-08-31 (×8): qty 100

## 2021-08-31 MED ORDER — MIDAZOLAM HCL 2 MG/2ML IJ SOLN
INTRAMUSCULAR | Status: AC
  Filled 2021-08-31: qty 2

## 2021-08-31 MED ORDER — CHLORHEXIDINE GLUCONATE 0.12% ORAL RINSE (MEDLINE KIT)
15.0000 mL | Freq: Two times a day (BID) | OROMUCOSAL | Status: DC
  Administered 2021-08-31 – 2021-09-02 (×6): 15 mL via OROMUCOSAL

## 2021-08-31 MED ORDER — SODIUM CHLORIDE 0.9 % IV SOLN
250.0000 mL | INTRAVENOUS | Status: DC
  Administered 2021-08-31: 250 mL via INTRAVENOUS

## 2021-08-31 MED ORDER — PANTOPRAZOLE INFUSION (NEW) - SIMPLE MED
8.0000 mg/h | INTRAVENOUS | Status: DC
  Administered 2021-08-31 – 2021-09-02 (×7): 8 mg/h via INTRAVENOUS
  Filled 2021-08-31 (×2): qty 100
  Filled 2021-08-31 (×6): qty 80

## 2021-08-31 MED ORDER — PANTOPRAZOLE 80MG IVPB - SIMPLE MED
80.0000 mg | Freq: Once | INTRAVENOUS | Status: AC
  Administered 2021-08-31: 80 mg via INTRAVENOUS
  Filled 2021-08-31: qty 80

## 2021-08-31 MED ORDER — SODIUM CHLORIDE 0.9 % IV SOLN
3.0000 g | Freq: Four times a day (QID) | INTRAVENOUS | Status: DC
  Administered 2021-08-31 – 2021-09-03 (×14): 3 g via INTRAVENOUS
  Filled 2021-08-31 (×15): qty 8

## 2021-08-31 MED ORDER — DEXMEDETOMIDINE HCL IN NACL 400 MCG/100ML IV SOLN
0.0000 ug/kg/h | INTRAVENOUS | Status: AC
  Administered 2021-08-31: 0.5 ug/kg/h via INTRAVENOUS
  Administered 2021-08-31 (×2): 0.8 ug/kg/h via INTRAVENOUS
  Administered 2021-09-01: 1.2 ug/kg/h via INTRAVENOUS
  Administered 2021-09-01: 0.7 ug/kg/h via INTRAVENOUS
  Administered 2021-09-01 (×2): 0.8 ug/kg/h via INTRAVENOUS
  Administered 2021-09-01: 0.7 ug/kg/h via INTRAVENOUS
  Administered 2021-09-02: 1 ug/kg/h via INTRAVENOUS
  Administered 2021-09-02 (×4): 1.2 ug/kg/h via INTRAVENOUS
  Administered 2021-09-03: 0.5 ug/kg/h via INTRAVENOUS
  Filled 2021-08-31 (×10): qty 100
  Filled 2021-08-31: qty 200
  Filled 2021-08-31 (×2): qty 100

## 2021-08-31 MED ORDER — THIAMINE HCL 100 MG/ML IJ SOLN
100.0000 mg | Freq: Every day | INTRAMUSCULAR | Status: DC
  Administered 2021-08-31 – 2021-09-02 (×3): 100 mg via INTRAVENOUS
  Filled 2021-08-31 (×3): qty 2

## 2021-08-31 MED ORDER — MIDAZOLAM HCL 2 MG/2ML IJ SOLN
2.0000 mg | INTRAMUSCULAR | Status: DC | PRN
  Administered 2021-08-31 – 2021-09-01 (×4): 2 mg via INTRAVENOUS
  Filled 2021-08-31 (×3): qty 2

## 2021-08-31 MED ORDER — DIPHENHYDRAMINE HCL 50 MG/ML IJ SOLN
25.0000 mg | Freq: Three times a day (TID) | INTRAMUSCULAR | Status: DC | PRN
  Administered 2021-08-31: 25 mg via INTRAVENOUS
  Filled 2021-08-31: qty 1

## 2021-08-31 NOTE — Consult Note (Addendum)
Referring Provider: [facility] PCP: Pcp, No  Gastroenterologist: Althia Forts  Reason for consultation:   GI bleed             ASSESSMENT / PLAN   # 51 yo male admitted with history of Etoh abuse, cirrhosis on [place] presenting with AMS , first thought to be drug overdose.  MELD 17.  Acute encephalopathy possibly acute Etoh intoxication vr hepatic encephalopathy vrs drug OD ( drug not captured on UDS?)  Currently intubated on Levophed --UDS is negative. --Etoh level 280 --Ammonia 89. --Cirrhosis likely Etoh related but will obtain complete hepatic serologic workup to rule out other co-existing etiologies.  --Continue Unasyn ( treating for possible aspiration PNA). If this is stopped then should give Rocephin for SBP prophylaxis --Continue PPI infusion --Continue Octreotide infusion for now --Add lactulose at some point --Timing of EGD to be determined. He will need varices screening. If starts bleeding then EGD sooner rather than later.   # [place] anemia / ? GI bleed. Re[ported small amount of bloody drainage in OGT earlier.  Appears to have chronic anemia with hgb of 7.2 during ED visit in July 2022. This time presents with hgb of 10.5  down to 8.0. OGT clamped, no blood drainage in tube but some dark debris around teeth.   No melena on exam ( stool in vault is brown). No evidence for significant GI bleeding at present but at increased risk, especially since status of varices is unknown.  --Monitor for GI bleed --Trend Hgb  # Additional medical history listed below.   HISTORY OF PRESENT ILLNESS                                                                                                                         Chief Complaint: blood in OGT   Alessander Sikorski is a 51 y.o. male with a past medical history significant for Etoh abuse, cirrhosis. See PMH for any additional medical problems.  Patient presented to ED yesterday ( ? after drug overdose). He was found unresponsive. Family  reported use of meth and Etoh. GCS of 3. Head CT scan showed chronic atrophy, no acute findings. He was tachycardic, lactic acid level elevated.  Liver chemistries mildly elevated with enzyme elevation pattern c/w with ETOH, WBC normal. WBC low at 10.2, MCV 92. Salicylate level < 7. Etoh 280. Urine tox screen negative. He was intubated.  At some point he was found to have bloody liquid coming from OG tube. Started on protonix, Octreotide, given FFP for elevated INR of 2.5 . Started on antibiotics for possible aspiration..  Since admission his hgb was declined from 10.2 to 8.0 ( the 6.6 earlier this am may not have been correct) Of note, it was 2.2 back in July 2022 when he came to ED with left sided weakness, falls and Etoh intoxication. He left AMA.   RUQ [place] today shows mild perihepatic ascites, small R pleural effusion, changes of cirrhosis.  Ammonia is 89. INR  improved to 1.6. Hgb trending down 10.2 >> 9.5 >> 8.0.   Mother in room. Patient sedated / intubated. Mother says that patient has a history of Etoh abuse. After his divorce a year ago the Etoh abuse got worse. He lives alone. He has a daughter and a son ( one in [place], the other in [place]). Mother says that patient told her a year ago when he moved from [place] that he had cirrhosis.  He also told her he had leukemia and multiple sclerosis but hasn't given her details.   ENDOSCOPIC EVALUATIONS     IMAGING:  CT Head Wo Contrast  Result Date: 09/20/2021 CLINICAL DATA:  Unresponsive.  Drug overdose. EXAM: CT HEAD WITHOUT CONTRAST TECHNIQUE: Contiguous axial images were obtained from the base of the skull through the vertex without intravenous contrast. RADIATION DOSE REDUCTION: This exam was performed according to the departmental dose-optimization program which includes automated exposure control, adjustment of the mA and/or kV according to patient size and/or use of iterative reconstruction technique. COMPARISON:  None. FINDINGS: Brain: No evidence of  acute infarction, hemorrhage, hydrocephalus, extra-axial collection or mass lesion/mass effect. Mild diffuse cerebral atrophy. Vascular: Intracranial arterial vascular calcifications. Skull: Calvarium appears intact. Sinuses/Orbits: Retention cysts in the left maxillary antrum. Mucosal thickening in the paranasal sinuses. No acute air-fluid levels. Mastoid air cells are clear. Other: None. IMPRESSION: No acute intracranial abnormalities. Chronic atrophy. Inflammatory changes in the paranasal sinuses. Electronically Signed   By: Lucienne Capers M.D.   On: 09/11/2021 22:11   CT Cervical Spine Wo Contrast  Result Date: 09/06/2021 CLINICAL DATA:  Unresponsive after drug overdose.  Neck trauma. EXAM: CT CERVICAL SPINE WITHOUT CONTRAST TECHNIQUE: Multidetector CT imaging of the cervical spine was performed without intravenous contrast. Multiplanar CT image reconstructions were also generated. RADIATION DOSE REDUCTION: This exam was performed according to the departmental dose-optimization program which includes automated exposure control, adjustment of the mA and/or kV according to patient size and/or use of iterative reconstruction technique. COMPARISON:  None. FINDINGS: Alignment: Straightening of usual cervical lordosis without anterior subluxation. This is likely positional but could indicate muscle spasm. Skull base and vertebrae: Skull base appears intact. No vertebral compression deformities. Enlarged neural foramen at C4 on the right likely indicates a perineural cyst or root sleeve diverticulum. No focal bone lesion or bone destruction. C1-2 articulation appears intact. Soft tissues and spinal canal: No prevertebral soft tissue swelling. No abnormal paraspinal soft tissue mass or infiltration. Enteric and endotracheal tubes are present. Disc levels:  Intervertebral disc space heights are normal. Upper chest: Consolidation or atelectasis in the right posteromedial upper lung. Other: None. IMPRESSION: 1.  Nonspecific straightening of usual cervical lordosis. 2. No acute displaced fractures identified. 3. Bone remodeling at the right neural foramen at C4 likely indicating perineural cyst or recently diverticulum. 4. Consolidation or atelectasis in the right upper lung. Electronically Signed   By: Lucienne Capers M.D.   On: 09/04/2021 22:14   DG Chest Portable 1 View  Result Date: 08/27/2021 CLINICAL DATA:  Altered mental status EXAM: PORTABLE CHEST 1 VIEW COMPARISON:  8:57 p.m. FINDINGS: Endotracheal tube is seen with its tip at the level of clavicular head 6.7 cm above the carina. Nasogastric tube extends into the proximal body of the stomach. Lung volumes are small with mild elevation of the right hemidiaphragm. Small right pleural effusion is difficult to exclude. Lungs are otherwise clear. No pneumothorax. Cardiac size within normal limits. Pulmonary vascularity is normal. No acute bone abnormality. IMPRESSION: Stable support tubes.  Possible small right pleural effusion. Electronically Signed   By: Fidela Salisbury M.D.   On: 09/15/2021 21:07   US Abdomen Limited RUQ (LIVER/GB)  Result Date: 08/31/2021 CLINICAL DATA:  Cirrhosis EXAM: ULTRASOUND ABDOMEN LIMITED RIGHT UPPER QUADRANT COMPARISON:  None. FINDINGS: Gallbladder: Gallbladder is contracted. No visible stones or pericholecystic fluid. Common bile duct: Diameter: Normal caliber, 5-6 mm. Liver: Heterogeneous echotexture with nodular contours. No focal hepatic abnormality. Portal vein is patent on color Doppler imaging with normal direction of blood flow towards the liver. Other: Mild perihepatic ascites.  Small right pleural effusion. IMPRESSION: Changes of cirrhosis. Perihepatic ascites.  Small right effusion. Electronically Signed   By: Rolm Baptise M.D.   On: 08/31/2021 01:24     No past medical history on file.  No past surgical history on file.  Prior to Admission medications   Not on File    Current Facility-Administered Medications   Medication Dose Route Frequency Provider Last Rate Last Admin   0.9 %  sodium chloride infusion  250 mL Intravenous Continuous Cecilie Lowers T, MD 10 mL/hr at 08/31/21 0636 250 mL at 08/31/21 0636   Ampicillin-Sulbactam (UNASYN) 3 g in sodium chloride 0.9 % 100 mL IVPB  3 g Intravenous Q6H Collier Bullock, MD   Stopped at 08/31/21 0809   chlorhexidine gluconate (MEDLINE KIT) (PERIDEX) 0.12 % solution 15 mL  15 mL Mouth Rinse BID Jacky Kindle, MD   15 mL at 08/31/21 0844   Chlorhexidine Gluconate Cloth 2 % PADS 6 each  6 each Topical Q0600 Jacky Kindle, MD   6 each at 08/31/21 0800   dexmedetomidine (PRECEDEX) 400 MCG/100ML (4 mcg/mL) infusion  0-1.2 mcg/kg/hr Intravenous Continuous Jacky Kindle, MD 11.34 mL/hr at 08/31/21 0842 0.5 mcg/kg/hr at 08/31/21 0842   docusate (COLACE) 50 MG/5ML liquid 100 mg  100 mg Per Tube BID PRN Cristal Generous, NP       docusate (COLACE) 50 MG/5ML liquid 100 mg  100 mg Per Tube BID Jacky Kindle, MD   100 mg at 08/31/21 1015   fentaNYL (SUBLIMAZE) bolus via infusion 50 mcg  50 mcg Intravenous Q1H PRN Elnora Morrison, MD       fentaNYL 2526mg in NS 2572m(1067mml) infusion-PREMIX  25-400 mcg/hr Intravenous Continuous ZavElnora MorrisonD 10 mL/hr at 08/31/21 0004 100 mcg/hr at 08/31/21 0004   lactated ringers bolus 1,000 mL  1,000 mL Intravenous Once ChaJacky KindleD       MEDLINE mouth rinse  15 mL Mouth Rinse 10 times per day ChaJacky KindleD   15 mL at 08/31/21 1013   [START ON 09/01/2021] multivitamin liquid 15 mL  15 mL Per Tube Daily Bowser, GraLaurel DimmerP       mupirocin ointment (BACTROBAN) 2 % 1 application  1 application Nasal BID ChaJacky KindleD   1 application at 02/34/19/3713   norepinephrine (LEVOPHED) 4mg46m 250mL62m016 mg/mL) premix infusion  2-10 mcg/min Intravenous Titrated MohanCecilie LowersD 26.3 mL/hr at 08/31/21 0603 7 mcg/min at 08/31/21 0603   octreotide (SANDOSTATIN) 500 mcg in sodium chloride 0.9 % 250 mL (2 mcg/mL) infusion  50 mcg/hr  Intravenous Continuous BowseCristal Generous25 mL/hr at 08/31/21 0332 50 mcg/hr at 08/31/21 0332   ondansetron (ZOFRAN) injection 4 mg  4 mg Intravenous Q6H PRN BowseCristal Generous      pantoprozole (PROTONIX) 80 mg /NS 100 mL infusion  8 mg/hr Intravenous Continuous BowseCristal Generous10 mL/hr at 08/31/21  1004 8 mg/hr at 08/31/21 1004   polyethylene glycol (MIRALAX / GLYCOLAX) packet 17 g  17 g Per Tube Daily PRN Bowser, Laurel Dimmer, NP       polyethylene glycol (MIRALAX / GLYCOLAX) packet 17 g  17 g Per Tube Daily Jacky Kindle, MD   17 g at 08/31/21 1015   thiamine (B-1) injection 100 mg  100 mg Intravenous Daily Bowser, Laurel Dimmer, NP   100 mg at 08/31/21 1013    Allergies as of 09/12/2021   (Not on File)   [place]: no [place] of liver disease per mother   Social History   Socioeconomic History   Marital status: Divorced    Spouse name: Not on file   Number of children: Not on file   Years of education: Not on file   Highest education level: Not on file  Occupational History   Not on file  Tobacco Use   Smoking status: Not on file   Smokeless tobacco: Not on file  Substance and Sexual Activity   Alcohol use: Not on file   Drug use: Not on file   Sexual activity: Not on file  Other Topics Concern   Not on file  Social History Narrative   Not on file   Social Determinants of Health   Financial Resource Strain: Not on file  Food Insecurity: Not on file  Transportation Needs: Not on file  Physical Activity: Not on file  Stress: Not on file  Social Connections: Not on file  Intimate Partner Violence: Not on file    Review of Systems: All systems reviewed and negative except where noted in HPI.  OBJECTIVE    Physical Exam: Vital signs in last 24 hours: Temp:  [98.1 F (36.7 C)-100 F (37.8 C)] 98.1 F (36.7 C) (02/07 1100) Pulse Rate:  [15-148] 90 (02/07 1100) Resp:  [16-33] 20 (02/07 1100) BP: (68-132)/(45-76) 99/68 (02/07 1100) SpO2:  [87 %-100 %] 98 % (02/07  1100) FiO2 (%):  [50 %-100 %] 50 % (02/07 0757) Weight:  [90.7 kg] 90.7 kg (02/06 2033) Last BM Date:  (PTA)  General:  sedated / intubated.male in NAD. In soft restraints. OGT clamped Psych:  unable to assess.   Eyes:  closed Nose: No deformity, discharge or lesions Neck:  Supple, no masses felt Lungs:  Chest clear to auscultation.  Heart:  Regular rate, regular rhythm, 1+ BLE edema Abdomen:  Soft, nondistended, a few bowel sounds, no masses felt Rectal :  brown stool in vault Msk: Symmetrical without gross deformities.  Neurologic:  sedated.   Skin:  Intact without significant lesions.    Scheduled inpatient medications   Intake/Output from previous day: 02/06 0701 - 02/07 0700 In: 512 [Blood:512] Out: -  Intake/Output this shift: Total I/O In: 175 [NG/GT:175] Out: 350 [Urine:350]   Lab Results: Recent Labs    09/12/2021 2045 08/28/2021 2103 09/07/2021 2131 08/31/21 0453 08/31/21 0852  WBC 6.3  --   --  3.8* 6.5  HGB 10.2*   < > 9.5* 6.6* 8.0*  HCT 29.4*   < > 28.0* 20.3* 24.1*  PLT 103*  --   --  PLATELET CLUMPS NOTED ON SMEAR, UNABLE TO ESTIMATE 50*   < > = values in this interval not displayed.   BMET Recent Labs    09/20/2021 2045 09/01/2021 2103 08/29/2021 2131 08/31/21 0405  NA 130* 134*   134* 137 137  K 3.4* 3.4*   3.4* 3.1* 3.3*  CL 98 98  --  101  CO2 17*  --   --  17*  GLUCOSE 265* 255*  --  260*  BUN 12 12  --  11  CREATININE 1.14 1.30*  --  1.09  CALCIUM 7.4*  --   --  7.3*   LFTs Recent Labs    08/31/21 0405  PROT 5.9*  ALBUMIN 1.9*  AST 71*  ALT 32  ALKPHOS 163*  BILITOT 2.2*   PT/INR Recent Labs    09/13/2021 2045 08/31/21 0405  LABPROT 26.7* 18.8*  INR 2.5* 1.6*   Hepatitis Panel No results for input(s): HEPBSAG, HCVAB, HEPAIGM, HEPBIGM in the last 72 hours.   . CBC Latest Ref Rng & Units 08/31/2021 08/31/2021 08/29/2021  WBC 4.0 - 10.5 K/uL 6.5 3.8(L) -  Hemoglobin 13.0 - 17.0 g/dL 8.0(L) 6.6(LL) 9.5(L)  Hematocrit 39.0 - 52.0 %  24.1(L) 20.3(L) 28.0(L)  Platelets 150 - 400 K/uL 50(L) PLATELET CLUMPS NOTED ON SMEAR, UNABLE TO ESTIMATE -    . CMP Latest Ref Rng & Units 08/31/2021 09/02/2021 09/04/2021  Glucose 70 - 99 mg/dL 260(H) - 255(H)  BUN 6 - 20 mg/dL 11 - 12  Creatinine 0.61 - 1.24 mg/dL 1.09 - 1.30(H)  Sodium 135 - 145 mmol/L 137 137 134(L)  Potassium 3.5 - 5.1 mmol/L 3.3(L) 3.1(L) 3.4(L)  Chloride 98 - 111 mmol/L 101 - 98  CO2 22 - 32 mmol/L 17(L) - -  Calcium 8.9 - 10.3 mg/dL 7.3(L) - -  Total Protein 6.5 - 8.1 g/dL 5.9(L) - -  Total Bilirubin 0.3 - 1.2 mg/dL 2.2(H) - -  Alkaline Phos 38 - 126 U/L 163(H) - -  AST 15 - 41 U/L 71(H) - -  ALT 0 - 44 U/L 32 - -     Principal Problem:   Acute encephalopathy    Tye Savoy, NP-C @  08/31/2021, 11:32 AM   I have taken a history, reviewed the chart and examined the patient. I performed a substantive portion of this encounter, including complete performance of at least one of the key components, in conjunction with the APP. 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  Following the initial OG tube insertion, there has been no further bloody output and the patient has not passed any stools to suggest an upper GI bleed.  His BUN was not elevated.  Suspect hgb 6.6 to be spurious/inaccurate (WBC also down).   Although I agree it is reasonable to proceed with medical therapy for possible variceal bleed, I do not think an urgent endoscopy is necessary.  If he does not show any evidence of bleeding over the next 12 hours, I think it is okay to discontinue the octreotide.  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 Will  reassess need for EGD tomorrow  Based on AMS and elevated ammonia, I agree with giving lactulose through the OG, 30 gm TID, then titrating to 2-3 Bms/day.  Await results from labs to evaluate other causes of chronic liver disease (viral, autoimmune, metabolic/genetic)   Harriette Tovey E. Candis Schatz, MD [facility]

## 2021-08-31 NOTE — Progress Notes (Signed)
eLink Physician-Brief Progress Note Patient Name: Jeffrey Sullivan DOB: 05/22/1971 MRN: [id number]   Date of Service  08/31/2021  HPI/Events of Note  wildly restless in the ed with 4 nurses holding him down.  have prop and fent gtt ordered, prop dropping bp some.   asking for something. maybe a pressor to cover his bp so they can get him down???? only has PIV, not central access.  Acute encephalopathy EtOH abuse with acute alcohol intoxication  Hx polysubstance abuse -UDS neg.  -greater suspicion that current presentation is related to etoh intoxication vs drug OD, though cant r/o drugs that would not capture on UDS    eICU Interventions  Discussed with ED RN, coming to ICU.  Wean down propofol. Start Versed gtt. Received fenta pushes already. Start Levophed gtt via PIV for now.       Intervention Category Intermediate Interventions: Other: (agitation, hypotesnion from ED.)  Ranee Gosselin 08/31/2021, 5:34 AM

## 2021-08-31 NOTE — Progress Notes (Signed)
Rn and RT transported pt to 53m05 without complications.

## 2021-08-31 NOTE — Plan of Care (Signed)
  Problem: Safety: Goal: Non-violent Restraint(s) Outcome: Progressing   

## 2021-08-31 NOTE — Progress Notes (Signed)
NAME:  Jeffrey Sullivan, MRN:  [id number], DOB:  [birth year], LOS: 1 ADMISSION DATE:  09/20/2021, CONSULTATION DATE:  09/13/2021 REFERRING MD:  Judith Blonder - EM , CHIEF COMPLAINT:  Acute metabolic encephalopathy    History of Present Illness:  51 yo M PMH polysubstance abuse including EtOH abuse and meth abuse presented to [facility] ED via EMS with AMS after being found down, concerning for possible drug overdose. Pt was intubated for airway protection and limited history is available in the chart for review  CTH without acute intracranial process  Labs Na 130, K 3.4, CO2 17, Glu 265, Mag 1.2 Alk Phos 248 AST 89 ALT 35 Albumin 2.1  iCal 0.92   Hgb 10.2 HCT 29 plt 103   Ethyl alcohol 280 UDS negative   Pertinent  Medical History  Substance use disorder  Significant Hospital Events: Including procedures, antibiotic start and stop dates in addition to other pertinent events   2/7 found down OOH, BIB EMS as suspected OD. Intubated in ED. Admitting to ICU   Interim History / Subjective:  No more bleeding Seen by GI Continues to have elevated lactate  Objective   Blood pressure (!) 76/51, pulse 84, temperature (!) 97.5 F (36.4 C), resp. rate 20, height '5\' 8"'  (1.727 m), weight 90.7 kg, SpO2 95 %.    Vent Mode: PRVC FiO2 (%):  [50 %-100 %] 50 % Set Rate:  [16 bmp-20 bmp] 20 bmp Vt Set:  [550 mL] 550 mL PEEP:  [5 cmH20] 5 cmH20 Plateau Pressure:  [10 cmH20-22 cmH20] 22 cmH20   Intake/Output Summary (Last 24 hours) at 08/31/2021 1345 Last data filed at 08/31/2021 1200 Gross per 24 hour  Intake 3128.52 ml  Output 700 ml  Net 2428.52 ml   Filed Weights   08/31/2021 2033  Weight: 90.7 kg    Examination: General: Critically ill appearing middle aged M intubated  HENT: NCAT ETT secure with moderate thick tan secretions Trachea midline  Lungs: Coarse bilaterally. RUL rhonchi mechanically ventilated  Cardiovascular: tachycardic. S1s2 no rgm  Abdomen: soft ndnt + bowel sounds. Bloody output from  OGT  Extremities: no acute joint deformity. No cyanosis or clubbing  Neuro: Sedated, eyes closed, gets agitated and restless with decreased sedation, not following commands, moving all 4 extremities  GU: foley, yellow urine  Skin: scattered ecchymosis.   Resolved Hospital Problem list     Assessment & Plan:  Acute hepatic encephalopathy EtOH abuse with acute alcohol intoxication  Polysubstance abuse Patient with elevated alcohol level His ammonia is elevated Continue lactulose, will make sure at least he has 2 bowel movements Continue CIWA scale Continue thiamine and IV fluid His serum alcohol level was over 200 Watch for signs of withdrawal Continue Precedex and fentanyl for sedation  Acute hypoxic respiratory failure requiring intubation Small right sided pleural effusion  Suspected aspiration pneumonia Continue lung protective ventilation SAT and SBT in the morning PAD protocol with RASS goal 0/-1 Currently on Precedex and fentanyl infusion VAP, pulm hygiene  Continue IV Unasyn  Upper GIB Acute on chronic blood loss anemia Patient had bloody OGT output This could be related to variceal bleeding Appreciate GI input Continue octreotide and Protonix infusion Monitor H&H Transfuse if less than 7  Acute alcoholic hepatitis Patient has long history of alcohol abuse His LFTs are elevated likely due to alcoholic hepatitis Closely monitor  Lactic acidosis Patient lactate is elevated, continue aggressive IV fluid resuscitation Part of elevated lactate is acute hepatitis and liver is unable to convert  lactate to 5 with  Hypocalcemia/Hypomagnesemia/Hyponatremia/Hypokalemia Continue aggressive electrolyte supplement  Thrombocytopenia Likely related to bone marrow suppression caused by alcohol  Best Practice (right click and "Reselect all SmartList Selections" daily)   Diet/type: NPO DVT prophylaxis: SCD GI prophylaxis: PPI Lines: N/A Foley:  Yes, and it is still  needed Code Status:  full code Last date of multidisciplinary goals of care discussion [2/7: Patient mother was updated, decision was to continue full scope of care]  Labs   CBC: Recent Labs  Lab 09/16/2021 2045 08/25/2021 2103 09/06/2021 2131 08/31/21 0453 08/31/21 0852  WBC 6.3  --   --  3.8* 6.5  NEUTROABS 4.2  --   --   --   --   HGB 10.2* 10.9*   10.5* 9.5* 6.6* 8.0*  HCT 29.4* 32.0*   31.0* 28.0* 20.3* 24.1*  MCV 92.7  --   --  96.7 94.5  PLT 103*  --   --  PLATELET CLUMPS NOTED ON SMEAR, UNABLE TO ESTIMATE 50*    Basic Metabolic Panel: Recent Labs  Lab 09/04/2021 2045 08/28/2021 2103 09/16/2021 2131 08/31/21 0405  NA 130* 134*   134* 137 137  K 3.4* 3.4*   3.4* 3.1* 3.3*  CL 98 98  --  101  CO2 17*  --   --  17*  GLUCOSE 265* 255*  --  260*  BUN 12 12  --  11  CREATININE 1.14 1.30*  --  1.09  CALCIUM 7.4*  --   --  7.3*  MG 1.2*  --   --  1.3*   GFR: Estimated Creatinine Clearance: 88.6 mL/min (by C-G formula based on SCr of 1.09 mg/dL). Recent Labs  Lab 09/19/2021 2045 09/04/2021 2311 08/31/21 0405 08/31/21 0453 08/31/21 0852  WBC 6.3  --   --  3.8* 6.5  LATICACIDVEN 3.4* 3.4* 4.9*  --  5.7*    Liver Function Tests: Recent Labs  Lab 09/17/2021 2045 08/31/21 0405  AST 89* 71*  ALT 35 32  ALKPHOS 248* 163*  BILITOT 2.2* 2.2*  PROT 7.4 5.9*  ALBUMIN 2.1* 1.9*   No results for input(s): LIPASE, AMYLASE in the last 168 hours. Recent Labs  Lab 08/31/21 0405  AMMONIA 89*    ABG    Component Value Date/Time   PHART 7.305 (L) 08/26/2021 2131   PCO2ART 44.6 09/12/2021 2131   PO2ART 285 (H) 09/14/2021 2131   HCO3 22.1 08/29/2021 2131   TCO2 23 09/18/2021 2131   ACIDBASEDEF 4.0 (H) 09/21/2021 2131   O2SAT 100.0 08/26/2021 2131     Coagulation Profile: Recent Labs  Lab 09/14/2021 2045 08/31/21 0405  INR 2.5* 1.6*    Cardiac Enzymes: Recent Labs  Lab 09/07/2021 2045 08/31/21 0405  CKTOTAL 431* 275    HbA1C: Hgb A1c MFr Bld  Date/Time Value Ref  Range Status  08/31/2021 08:52 AM 7.0 (H) 4.8 - 5.6 % Final    Comment:    (NOTE) Pre diabetes:          5.7%-6.4%  Diabetes:              >6.4%  Glycemic control for   <7.0% adults with diabetes     CBG: Recent Labs  Lab 08/31/21 0805 08/31/21 1107  [place] 190* 178*    Critical care time:      Total critical care time: 46 minutes  Performed by: Adel care time was exclusive of separately billable procedures and treating other patients.  Critical care was necessary to treat or prevent imminent or life-threatening deterioration.   Critical care was time spent personally by me on the following activities: development of treatment plan with patient and/or surrogate as well as nursing, discussions with consultants, evaluation of patient's response to treatment, examination of patient, obtaining history from patient or surrogate, ordering and performing treatments and interventions, ordering and review of laboratory studies, ordering and review of radiographic studies, pulse oximetry and re-evaluation of patient's condition.   Jacky Kindle MD [facility] See Amion for pager If no response to pager, please call [phone] until 7pm After 7pm, Please call E-link [phone]

## 2021-08-31 NOTE — Progress Notes (Signed)
Inpatient Diabetes Program Recommendations  AACE/ADA: New Consensus Statement on Inpatient Glycemic Control (2015)  Target Ranges:  Prepandial:   less than 140 mg/dL      Peak postprandial:   less than 180 mg/dL (1-2 hours)      Critically ill patients:  140 - 180 mg/dL   Lab Results  Component Value Date   GLUCAP 175 (H) 08/31/2021   HGBA1C 7.0 (H) 08/31/2021    Review of Glycemic Control  Latest Reference Range & Units 08/31/21 08:05  Glucose-Capillary 70 - 99 mg/dL 161 (H)  (H): Data is abnormally high Diabetes history: No history of DM noted Outpatient Diabetes medications: None  Inpatient Diabetes Program Recommendations:    If appropriate, consider adding Novolog sensitive correction q 4 hours.   Thanks,  Beryl Meager, RN, BC-ADM Inpatient Diabetes Coordinator Pager [phone]  (8a-5p)

## 2021-09-01 ENCOUNTER — Inpatient Hospital Stay (HOSPITAL_COMMUNITY): Payer: Medicaid Other

## 2021-09-01 DIAGNOSIS — K7030 Alcoholic cirrhosis of liver without ascites: Secondary | ICD-10-CM

## 2021-09-01 DIAGNOSIS — K7460 Unspecified cirrhosis of liver: Secondary | ICD-10-CM

## 2021-09-01 DIAGNOSIS — J9602 Acute respiratory failure with hypercapnia: Secondary | ICD-10-CM

## 2021-09-01 DIAGNOSIS — R188 Other ascites: Secondary | ICD-10-CM

## 2021-09-01 LAB — BASIC METABOLIC PANEL
Anion gap: 11 (ref 5–15)
BUN: 14 mg/dL (ref 6–20)
CO2: 19 mmol/L — ABNORMAL LOW (ref 22–32)
Calcium: 8.2 mg/dL — ABNORMAL LOW (ref 8.9–10.3)
Chloride: 111 mmol/L (ref 98–111)
Creatinine, Ser: 1.16 mg/dL (ref 0.61–1.24)
GFR, Estimated: >60 mL/min (ref >60)
Glucose, Bld: 192 mg/dL — ABNORMAL HIGH (ref 70–99)
Potassium: 4 mmol/L (ref 3.5–5.1)
Sodium: 141 mmol/L (ref 135–145)

## 2021-09-01 LAB — POCT I-STAT 7, (LYTES, BLD GAS, ICA,H+H)
Acid-Base Excess: 1 mmol/L (ref 0.0–2.0)
Acid-base deficit: 7 mmol/L — ABNORMAL HIGH (ref 0.0–2.0)
Bicarbonate: 25.5 mmol/L (ref 20.0–28.0)
Bicarbonate: 23.5 mmol/L (ref 20.0–28.0)
Calcium, Ion: 1.27 mmol/L (ref 1.15–1.40)
Calcium, Ion: 1.14 mmol/L — ABNORMAL LOW (ref 1.15–1.40)
HCT: 28 % — ABNORMAL LOW (ref 39.0–52.0)
HCT: 25 % — ABNORMAL LOW (ref 39.0–52.0)
Hemoglobin: 9.5 g/dL — ABNORMAL LOW (ref 13.0–17.0)
Hemoglobin: 8.5 g/dL — ABNORMAL LOW (ref 13.0–17.0)
O2 Saturation: 98 %
O2 Saturation: 97 %
Patient temperature: 96.6
Potassium: 3.9 mmol/L (ref 3.5–5.1)
Potassium: 3.5 mmol/L (ref 3.5–5.1)
Sodium: 144 mmol/L (ref 135–145)
Sodium: 145 mmol/L (ref 135–145)
TCO2: 29 mmol/L (ref 22–32)
TCO2: 24 mmol/L (ref 22–32)
pCO2 arterial: 106 mmHg (ref 32.0–48.0)
pCO2 arterial: 26.6 mmHg — ABNORMAL LOW (ref 32.0–48.0)
pH, Arterial: 6.99 — CL (ref 7.350–7.450)
pH, Arterial: 7.55 — ABNORMAL HIGH (ref 7.350–7.450)
pO2, Arterial: 154 mmHg — ABNORMAL HIGH (ref 83.0–108.0)
pO2, Arterial: 75 mmHg — ABNORMAL LOW (ref 83.0–108.0)

## 2021-09-01 LAB — TYPE AND SCREEN
ABO/RH(D): B POS
Antibody Screen: NEGATIVE
Unit division: 0
Unit division: 0

## 2021-09-01 LAB — BPAM RBC
Blood Product Expiration Date: 202302222359
Blood Product Expiration Date: 202302232359
Unit Type and Rh: 7300
Unit Type and Rh: 7300

## 2021-09-01 LAB — PREPARE FRESH FROZEN PLASMA: Unit division: 0

## 2021-09-01 LAB — MAGNESIUM
Magnesium: 1.1 mg/dL — ABNORMAL LOW (ref 1.7–2.4)
Magnesium: 1.5 mg/dL — ABNORMAL LOW (ref 1.7–2.4)

## 2021-09-01 LAB — BPAM FFP
Blood Product Expiration Date: 202302122359
Blood Product Expiration Date: 202302122359
ISSUE DATE / TIME: 202302070306
ISSUE DATE / TIME: 202302070419
Unit Type and Rh: 7300
Unit Type and Rh: 7300

## 2021-09-01 LAB — CBC
HCT: 23.1 % — ABNORMAL LOW (ref 39.0–52.0)
Hemoglobin: 7.9 g/dL — ABNORMAL LOW (ref 13.0–17.0)
MCH: 31.9 pg (ref 26.0–34.0)
MCHC: 34.2 g/dL (ref 30.0–36.0)
MCV: 93.1 fL (ref 80.0–100.0)
Platelets: 45 10*3/uL — ABNORMAL LOW (ref 150–400)
RBC: 2.48 MIL/uL — ABNORMAL LOW (ref 4.22–5.81)
RDW: 15.6 % — ABNORMAL HIGH (ref 11.5–15.5)
WBC: 6.3 10*3/uL (ref 4.0–10.5)
nRBC: 0 % (ref 0.0–0.2)

## 2021-09-01 LAB — COMPREHENSIVE METABOLIC PANEL
ALT: 23 U/L (ref 0–44)
AST: 61 U/L — ABNORMAL HIGH (ref 15–41)
Albumin: 2.2 g/dL — ABNORMAL LOW (ref 3.5–5.0)
Alkaline Phosphatase: 111 U/L (ref 38–126)
Anion gap: 10 (ref 5–15)
BUN: 13 mg/dL (ref 6–20)
CO2: 20 mmol/L — ABNORMAL LOW (ref 22–32)
Calcium: 7.7 mg/dL — ABNORMAL LOW (ref 8.9–10.3)
Chloride: 108 mmol/L (ref 98–111)
Creatinine, Ser: 0.94 mg/dL (ref 0.61–1.24)
GFR, Estimated: >60 mL/min (ref >60)
Glucose, Bld: 172 mg/dL — ABNORMAL HIGH (ref 70–99)
Potassium: 3.2 mmol/L — ABNORMAL LOW (ref 3.5–5.1)
Sodium: 138 mmol/L (ref 135–145)
Total Bilirubin: 5 mg/dL — ABNORMAL HIGH (ref 0.3–1.2)
Total Protein: 5.8 g/dL — ABNORMAL LOW (ref 6.5–8.1)

## 2021-09-01 LAB — GLUCOSE, CAPILLARY
Glucose-Capillary: 164 mg/dL — ABNORMAL HIGH (ref 70–99)
Glucose-Capillary: 152 mg/dL — ABNORMAL HIGH (ref 70–99)
Glucose-Capillary: 173 mg/dL — ABNORMAL HIGH (ref 70–99)
Glucose-Capillary: 200 mg/dL — ABNORMAL HIGH (ref 70–99)
Glucose-Capillary: 212 mg/dL — ABNORMAL HIGH (ref 70–99)

## 2021-09-01 LAB — TISSUE TRANSGLUTAMINASE, IGA: Tissue Transglutaminase Ab, IgA: 4 U/mL — ABNORMAL HIGH (ref 0–3)

## 2021-09-01 LAB — PHOSPHORUS
Phosphorus: 1.7 mg/dL — ABNORMAL LOW (ref 2.5–4.6)
Phosphorus: 2.1 mg/dL — ABNORMAL LOW (ref 2.5–4.6)

## 2021-09-01 LAB — MITOCHONDRIAL ANTIBODIES: Mitochondrial M2 Ab, IgG: <20 Units (ref 0.0–20.0)

## 2021-09-01 LAB — LACTIC ACID, PLASMA: Lactic Acid, Venous: 2.2 mmol/L (ref 0.5–1.9)

## 2021-09-01 LAB — CERULOPLASMIN: Ceruloplasmin: 12.2 mg/dL — ABNORMAL LOW (ref 16.0–31.0)

## 2021-09-01 LAB — ANTI-SMOOTH MUSCLE ANTIBODY, IGG: F-Actin IgG: 10 Units (ref 0–19)

## 2021-09-01 LAB — IGA: IgA: 1090 mg/dL — ABNORMAL HIGH (ref 90–386)

## 2021-09-01 LAB — ALPHA-1-ANTITRYPSIN: A-1 Antitrypsin, Ser: 115 mg/dL (ref 101–187)

## 2021-09-01 LAB — ANA: Anti Nuclear Antibody (ANA): NEGATIVE

## 2021-09-01 LAB — AFP TUMOR MARKER: AFP, Serum, Tumor Marker: 3.9 ng/mL (ref 0.0–6.9)

## 2021-09-01 MED ORDER — DEXTROSE 5 % IV SOLN
6.0000 g | Freq: Two times a day (BID) | INTRAVENOUS | Status: AC
  Administered 2021-09-01: 6 g via INTRAVENOUS
  Filled 2021-09-01: qty 12

## 2021-09-01 MED ORDER — KETAMINE HCL 50 MG/5ML IJ SOSY
PREFILLED_SYRINGE | INTRAMUSCULAR | Status: AC
  Filled 2021-09-01: qty 5

## 2021-09-01 MED ORDER — POTASSIUM CHLORIDE 20 MEQ PO PACK
20.0000 meq | PACK | Freq: Three times a day (TID) | ORAL | Status: DC
  Administered 2021-09-01 (×2): 20 meq
  Filled 2021-09-01 (×3): qty 1

## 2021-09-01 MED ORDER — ROCURONIUM BROMIDE 10 MG/ML (PF) SYRINGE
PREFILLED_SYRINGE | INTRAVENOUS | Status: AC
  Administered 2021-09-01: 100 mg via INTRAVENOUS
  Filled 2021-09-01: qty 10

## 2021-09-01 MED ORDER — LACTULOSE 10 GM/15ML PO SOLN
30.0000 g | ORAL | Status: DC
  Administered 2021-09-01 – 2021-09-02 (×10): 30 g
  Filled 2021-09-01 (×10): qty 45

## 2021-09-01 MED ORDER — MIDAZOLAM HCL 2 MG/2ML IJ SOLN
2.0000 mg | INTRAMUSCULAR | Status: DC | PRN
  Administered 2021-09-01 – 2021-09-02 (×5): 2 mg via INTRAVENOUS
  Filled 2021-09-01 (×5): qty 2

## 2021-09-01 MED ORDER — FENTANYL BOLUS VIA INFUSION
50.0000 ug | INTRAVENOUS | Status: DC | PRN
  Administered 2021-09-01 (×4): 100 ug via INTRAVENOUS
  Administered 2021-09-02: 50 ug via INTRAVENOUS
  Administered 2021-09-02: 100 ug via INTRAVENOUS
  Administered 2021-09-02: 50 ug via INTRAVENOUS
  Administered 2021-09-02 – 2021-09-03 (×3): 100 ug via INTRAVENOUS
  Administered 2021-09-03: 50 ug via INTRAVENOUS
  Administered 2021-09-03: 100 ug via INTRAVENOUS
  Filled 2021-09-01: qty 100

## 2021-09-01 MED ORDER — ADULT MULTIVITAMIN W/MINERALS CH
1.0000 | ORAL_TABLET | Freq: Every day | ORAL | Status: DC
  Administered 2021-09-01 – 2021-09-02 (×2): 1
  Filled 2021-09-01 (×2): qty 1

## 2021-09-01 MED ORDER — SODIUM BICARBONATE 650 MG PO TABS: 650.0000 mg | ORAL_TABLET | Freq: Once | ORAL | Status: DC

## 2021-09-01 MED ORDER — FENTANYL 2500MCG IN NS 250ML (10MCG/ML) PREMIX INFUSION
50.0000 ug/h | INTRAVENOUS | Status: DC
  Administered 2021-09-01: 200 ug/h via INTRAVENOUS
  Administered 2021-09-02: 300 ug/h via INTRAVENOUS
  Administered 2021-09-02 – 2021-09-03 (×2): 200 ug/h via INTRAVENOUS
  Administered 2021-09-03: 100 ug/h via INTRAVENOUS
  Filled 2021-09-01 (×5): qty 250

## 2021-09-01 MED ORDER — OSMOLITE 1.5 CAL PO LIQD
1000.0000 mL | ORAL | Status: AC
  Administered 2021-09-01: 17:00:00 1000 mL
  Filled 2021-09-01 (×3): qty 1000

## 2021-09-01 MED ORDER — FENTANYL CITRATE (PF) 100 MCG/2ML IJ SOLN
INTRAMUSCULAR | Status: AC
  Filled 2021-09-01: qty 2

## 2021-09-01 MED ORDER — ROCURONIUM BROMIDE 10 MG/ML (PF) SYRINGE: 100.0000 mg | PREFILLED_SYRINGE | Freq: Once | INTRAVENOUS | Status: AC

## 2021-09-01 MED ORDER — ETOMIDATE 2 MG/ML IV SOLN
INTRAVENOUS | Status: AC
  Administered 2021-09-01: 20 mg via INTRAVENOUS
  Filled 2021-09-01: qty 20

## 2021-09-01 MED ORDER — POTASSIUM CHLORIDE 10 MEQ/100ML IV SOLN
10.0000 meq | INTRAVENOUS | Status: AC
  Administered 2021-09-01 (×3): 10 meq via INTRAVENOUS
  Filled 2021-09-01 (×3): qty 100

## 2021-09-01 MED ORDER — DOCUSATE SODIUM 50 MG/5ML PO LIQD: 100.0000 mg | Freq: Two times a day (BID) | ORAL | Status: DC

## 2021-09-01 MED ORDER — MIDAZOLAM HCL 2 MG/2ML IJ SOLN
INTRAMUSCULAR | Status: AC
  Filled 2021-09-01: qty 2

## 2021-09-01 MED ORDER — PHENYLEPHRINE 40 MCG/ML (10ML) SYRINGE FOR IV PUSH (FOR BLOOD PRESSURE SUPPORT)
PREFILLED_SYRINGE | INTRAVENOUS | Status: AC
  Filled 2021-09-01: qty 10

## 2021-09-01 MED ORDER — ETOMIDATE 2 MG/ML IV SOLN: 20.0000 mg | Freq: Once | INTRAVENOUS | Status: AC

## 2021-09-01 MED ORDER — SODIUM BICARBONATE 8.4 % IV SOLN: 100.0000 meq | Freq: Once | INTRAVENOUS | Status: AC

## 2021-09-01 MED ORDER — POLYETHYLENE GLYCOL 3350 17 G PO PACK: 17.0000 g | PACK | Freq: Every day | ORAL | Status: DC

## 2021-09-01 MED ORDER — POTASSIUM CHLORIDE 20 MEQ PO PACK: 20.0000 meq | PACK | Freq: Two times a day (BID) | ORAL | Status: DC

## 2021-09-01 MED ORDER — CHLORHEXIDINE GLUCONATE CLOTH 2 % EX PADS
6.0000 | MEDICATED_PAD | CUTANEOUS | Status: DC
  Administered 2021-09-03: 6 via TOPICAL

## 2021-09-01 MED ORDER — SODIUM BICARBONATE 8.4 % IV SOLN
INTRAVENOUS | Status: AC
  Administered 2021-09-01: 100 meq via INTRAVENOUS
  Filled 2021-09-01: qty 100

## 2021-09-01 MED ORDER — MAGNESIUM SULFATE 2 GM/50ML IV SOLN
2.0000 g | Freq: Two times a day (BID) | INTRAVENOUS | Status: DC
  Administered 2021-09-01: 2 g via INTRAVENOUS
  Filled 2021-09-01: qty 50

## 2021-09-01 MED ORDER — PANCRELIPASE (LIP-PROT-AMYL) 10440-39150 UNITS PO TABS
20880.0000 [IU] | ORAL_TABLET | Freq: Once | ORAL | Status: DC
  Filled 2021-09-01: qty 2

## 2021-09-01 NOTE — Progress Notes (Signed)
Critical ABG results given to Dr. Merrily Pew.

## 2021-09-01 NOTE — Progress Notes (Signed)
Patient was extubated, initially was doing well but then he started getting agitated he was started on low-dose Precedex, with improvement in oxygen saturation but then patient was noted to be having frequent episodes of apnea, with frequent stimulation he was waking up but then again going back into deep sleep, Precedex was stopped completely despite that his respiratory rate remained low.  He became unresponsive ABGs were drawn which showed pH of 6.99 with PCO2 over 100, patient blood pressure pressure became soft with map of 64 due to acidosis.  Decision was made to emergently intubate the patient and placed him on mechanical ventilator   Acute hypoxic/hypercapnic respiratory failure Acute respiratory acidosis Hypotension related to acidosis  Continue lung protective ventilation Increase respiratory rate to clear hypercapnia and respiratory acidosis Closely watch vital signs We will get x-ray chest to confirm ET tube placement  Continue Precedex and fentanyl infusion postintubation with RASS goal -1/-2   Additional critical care time: 39 minutes  Performed by: Cheri Fowler   Critical care time was exclusive of separately billable procedures and treating other patients.   Critical care was necessary to treat or prevent imminent or life-threatening deterioration.   Critical care was time spent personally by me on the following activities: development of treatment plan with patient and/or surrogate as well as nursing, discussions with consultants, evaluation of patient's response to treatment, examination of patient, obtaining history from patient or surrogate, ordering and performing treatments and interventions, ordering and review of laboratory studies, ordering and review of radiographic studies, pulse oximetry and re-evaluation of patient's condition.   Cheri Fowler MD [facility] See Amion for pager If no response to pager, please call [phone] until 7pm After  7pm, Please call E-link [phone]

## 2021-09-01 NOTE — Plan of Care (Signed)
°  Problem: Safety: Goal: Non-violent Restraint(s) Outcome: Not Progressing   Problem: Education: Goal: Knowledge of General Education information will improve Description: Including pain rating scale, medication(s)/side effects and non-pharmacologic comfort measures Outcome: Not Progressing   Problem: Health Behavior/Discharge Planning: Goal: Ability to manage health-related needs will improve Outcome: Not Progressing   Problem: Clinical Measurements: Goal: Will remain free from infection Outcome: Progressing Goal: Diagnostic test results will improve Outcome: Progressing

## 2021-09-01 NOTE — Progress Notes (Addendum)
Progress Note Hospital Day: 3  Chief Complaint:  GI bleed ( resolved). Cirrhosis     ASSESSMENT AND PLAN   Brief History:  51 yo male with history of Etoh abuse, cirrhosis. Admitted with AMS / acute Etoh intoxication / ? Aspiration PNA. Required intubation.   # Etoh abuse, cirrhosis on [place] presenting with AMS , first thought to be drug overdose.  MELD 17.  Acute encephalopathy possibly acute Etoh intoxication vr hepatic encephalopathy vrs drug OD ( drug not captured on UDS?)   --UDS is negative. --Etoh level 280 --Ammonia 89. --Cirrhosis likely Etoh related but hepatic serologic workup in progress. So far, viral hep A,B,C negative.  ANA pending. A-1 Antitrypsin, AMA, ASMA, negative. Ceruloplasmin low at 12, Ferritin 40, tTg pending --Getting Unasyn ( treating for possible aspiration PNA). If this is stopped then should give Rocephin for SBP prophylaxis ( needs total of 5 days of antibiotics) --Continue Lactulose.  --[place] screening. Timing of EGD to be determined. --[place] screening. No focal liver lesions reported on [place]. AFP normal.  --Will need outpatient HAV, HBV vaccinations   # [place] anemia / ? GI bleed. Re[ported small amount of bloody drainage in OGT yesterday. Appears to have chronic anemia with hgb of 7.2 during ED visit in July 2022.   --No evidence for ongoing GI bleed yesterday nor today except there is some dried blood around mouth today ( extubated a few minutes ago).  --Hgb stable at 7.9 post 2 units PRBC --Continue PPI infusion. If no evidence for bleeding today then can change PPI to BID tomorrow.  --Continue Octreotide infusion for now    OBJECTIVE      Scheduled inpatient medications:   chlorhexidine gluconate (MEDLINE KIT)  15 mL Mouth Rinse BID   Chlorhexidine Gluconate Cloth  6 each Topical Q0600   docusate  100 mg Per Tube BID   lactulose  30 g Per Tube Q2H   mouth rinse  15 mL Mouth Rinse 10 times per day   multivitamin  15 mL Per Tube Daily    mupirocin ointment  1 application Nasal BID   polyethylene glycol  17 g Per Tube Daily   potassium chloride  20 mEq Per Tube TID   thiamine injection  100 mg Intravenous Daily   Continuous inpatient infusions:   albumin human 25 g (09/01/21 0803)   ampicillin-sulbactam (UNASYN) IV 3 g (09/01/21 1211)   dexmedetomidine (PRECEDEX) IV infusion 0.7 mcg/kg/hr (09/01/21 1138)   fentaNYL infusion INTRAVENOUS 300 mcg/hr (09/01/21 0900)   magnesium sulfate bolus IVPB     norepinephrine (LEVOPHED) Adult infusion 0.533 mcg/min (08/31/21 1600)   octreotide  (SANDOSTATIN)    IV infusion 50 mcg/hr (09/01/21 1040)   pantoprazole 8 mg/hr (09/01/21 1042)   potassium chloride 10 mEq (09/01/21 1329)   PRN inpatient medications: diphenhydrAMINE, docusate, fentaNYL, ondansetron (ZOFRAN) IV, polyethylene glycol  Vital signs in last 24 hours: Temp:  [96.8 F (36 C)-98.2 F (36.8 C)] 97.5 F (36.4 C) (02/08 1338) Pulse Rate:  [76-110] 99 (02/08 1338) Resp:  [16-20] 20 (02/08 1057) BP: (71-143)/(48-95) 120/84 (02/08 1015) SpO2:  [90 %-100 %] 90 % (02/08 1338) FiO2 (%):  [40 %-100 %] 100 % (02/08 1338) Weight:  [82.1 kg] 82.1 kg (02/08 0500) Last BM Date:  (PTA)  Intake/Output Summary (Last 24 hours) at 09/01/2021 1352 Last data filed at 09/01/2021 0900 Gross per 24 hour  Intake 2906.69 ml  Output 2375 ml  Net 531.69 ml  Physical Exam:  General: periodically opens eyes. Just extubated.  Heart:  Regular rate and rhythm. No lower extremity edema Pulmonary: Normal respiratory effort Abdomen: Soft, nondistended, nontender. A few bowel sounds.    Filed Weights   09/11/2021 2033 09/01/21 0500  Weight: 90.7 kg 82.1 kg    ntake/Output from previous day: 02/07 0701 - 02/08 0700 In: 4760.7 [I.V.:2438.3; NG/GT:425; IV Piggyback:1897.4] Out: 2725 [Urine:2725] Intake/Output this shift: Total I/O In: 762.5 [I.V.:182.5; NG/GT:180; IV Piggyback:400] Out: 350 [Urine:350]   DIAGNOSTIC STUDIES THIS  ADMISSION:  CT Head Wo Contrast  Result Date: 08/25/2021 CLINICAL DATA:  Unresponsive.  Drug overdose. EXAM: CT HEAD WITHOUT CONTRAST TECHNIQUE: Contiguous axial images were obtained from the base of the skull through the vertex without intravenous contrast. RADIATION DOSE REDUCTION: This exam was performed according to the departmental dose-optimization program which includes automated exposure control, adjustment of the mA and/or kV according to patient size and/or use of iterative reconstruction technique. COMPARISON:  None. FINDINGS: Brain: No evidence of acute infarction, hemorrhage, hydrocephalus, extra-axial collection or mass lesion/mass effect. Mild diffuse cerebral atrophy. Vascular: Intracranial arterial vascular calcifications. Skull: Calvarium appears intact. Sinuses/Orbits: Retention cysts in the left maxillary antrum. Mucosal thickening in the paranasal sinuses. No acute air-fluid levels. Mastoid air cells are clear. Other: None. IMPRESSION: No acute intracranial abnormalities. Chronic atrophy. Inflammatory changes in the paranasal sinuses. Electronically Signed   By: Lucienne Capers M.D.   On: 09/01/2021 22:11   CT Cervical Spine Wo Contrast  Result Date: 09/06/2021 CLINICAL DATA:  Unresponsive after drug overdose.  Neck trauma. EXAM: CT CERVICAL SPINE WITHOUT CONTRAST TECHNIQUE: Multidetector CT imaging of the cervical spine was performed without intravenous contrast. Multiplanar CT image reconstructions were also generated. RADIATION DOSE REDUCTION: This exam was performed according to the departmental dose-optimization program which includes automated exposure control, adjustment of the mA and/or kV according to patient size and/or use of iterative reconstruction technique. COMPARISON:  None. FINDINGS: Alignment: Straightening of usual cervical lordosis without anterior subluxation. This is likely positional but could indicate muscle spasm. Skull base and vertebrae: Skull base appears  intact. No vertebral compression deformities. Enlarged neural foramen at C4 on the right likely indicates a perineural cyst or root sleeve diverticulum. No focal bone lesion or bone destruction. C1-2 articulation appears intact. Soft tissues and spinal canal: No prevertebral soft tissue swelling. No abnormal paraspinal soft tissue mass or infiltration. Enteric and endotracheal tubes are present. Disc levels:  Intervertebral disc space heights are normal. Upper chest: Consolidation or atelectasis in the right posteromedial upper lung. Other: None. IMPRESSION: 1. Nonspecific straightening of usual cervical lordosis. 2. No acute displaced fractures identified. 3. Bone remodeling at the right neural foramen at C4 likely indicating perineural cyst or recently diverticulum. 4. Consolidation or atelectasis in the right upper lung. Electronically Signed   By: Lucienne Capers M.D.   On: 09/20/2021 22:14   DG Chest Portable 1 View  Result Date: 09/12/2021 CLINICAL DATA:  Altered mental status EXAM: PORTABLE CHEST 1 VIEW COMPARISON:  8:57 p.m. FINDINGS: Endotracheal tube is seen with its tip at the level of clavicular head 6.7 cm above the carina. Nasogastric tube extends into the proximal body of the stomach. Lung volumes are small with mild elevation of the right hemidiaphragm. Small right pleural effusion is difficult to exclude. Lungs are otherwise clear. No pneumothorax. Cardiac size within normal limits. Pulmonary vascularity is normal. No acute bone abnormality. IMPRESSION: Stable support tubes. Possible small right pleural effusion. Electronically Signed   By: Cassandria Anger  Christa See M.D.   On: 09/19/2021 21:07   US Abdomen Limited RUQ (LIVER/GB)  Result Date: 08/31/2021 CLINICAL DATA:  Cirrhosis EXAM: ULTRASOUND ABDOMEN LIMITED RIGHT UPPER QUADRANT COMPARISON:  None. FINDINGS: Gallbladder: Gallbladder is contracted. No visible stones or pericholecystic fluid. Common bile duct: Diameter: Normal caliber, 5-6 mm. Liver:  Heterogeneous echotexture with nodular contours. No focal hepatic abnormality. Portal vein is patent on color Doppler imaging with normal direction of blood flow towards the liver. Other: Mild perihepatic ascites.  Small right pleural effusion. IMPRESSION: Changes of cirrhosis. Perihepatic ascites.  Small right effusion. Electronically Signed   By: Rolm Baptise M.D.   On: 08/31/2021 01:24     Lab Results: Recent Labs    08/31/21 0453 08/31/21 0852 09/01/21 0626  WBC 3.8* 6.5 6.3  HGB 6.6* 8.0* 7.9*  HCT 20.3* 24.1* 23.1*  PLT PLATELET CLUMPS NOTED ON SMEAR, UNABLE TO ESTIMATE 50* 45*   BMET Recent Labs    09/08/2021 2045 09/06/2021 2103 09/16/2021 2131 08/31/21 0405 09/01/21 0626  NA 130* 134*   134* 137 137 138  K 3.4* 3.4*   3.4* 3.1* 3.3* 3.2*  CL 98 98  --  101 108  CO2 17*  --   --  17* 20*  GLUCOSE 265* 255*  --  260* 172*  BUN 12 12  --  11 13  CREATININE 1.14 1.30*  --  1.09 0.94  CALCIUM 7.4*  --   --  7.3* 7.7*   LFT Recent Labs    09/01/21 0626  PROT 5.8*  ALBUMIN 2.2*  AST 61*  ALT 23  ALKPHOS 111  BILITOT 5.0*   PT/INR Recent Labs    09/02/2021 2045 08/31/21 0405  LABPROT 26.7* 18.8*  INR 2.5* 1.6*   Hepatitis Panel Recent Labs    09/13/2021 2045  HEPBSAG NON REACTIVE  HCVAB NON REACTIVE  HEPAIGM NON REACTIVE  HEPBIGM NON REACTIVE      Principal Problem:   Acute encephalopathy     LOS: 2 days   Tye Savoy ,NP 09/01/2021, 1:52 PM   I have taken a history, reviewed the chart and examined the patient. I performed a substantive portion of this encounter, including complete performance of at least one of the key components, in conjunction with the APP. I agree with the APP's note, impression and recommendations  51 year old male with presumed alcoholic cirrhosis admitted with AMS/alcohol intoxication and suspected aspiration pneumonia requiring intubation due to AMS.  He has not had any evidence of ongoing GI bleed since the orogastric tube  placement.  His hemoglobin has been stable, BUN normal.  No indication for urgent endoscopy.  Continue PPI and octreotide for now.  Discontinue octreotide tomorrow if no further evidence of bleeding.   His ceruloplasmin was low at 12, which can sometimes occur in the setting of acute illness, but given the lack of any reliable history in this patient and the altered mental status/psychosis element, I think further evaluation for [place] Pulling disease is indicated. Recommend slit-lamp examination for [facility] rings and obtaining serum copper level.  Continue lactulose, titrating to achieve 2-3 bowel movements per day.  Recommend rechecking ammonia level.   Amariyon Maynes E. Candis Schatz, MD [facility]

## 2021-09-01 NOTE — Procedures (Signed)
Cortrak  Tube Type:  Cortrak - 43 inches Tube Location:  Left nare Initial Placement:  Stomach Secured by: Bridle Technique Used to Measure Tube Placement:  Marking at nare/corner of mouth Cortrak Secured At:  80 cm   Cortrak Tube Team Note:  Consult received to place a Cortrak feeding tube.   X-ray is required, abdominal x-ray has been ordered by the Cortrak team. Please confirm tube placement before using the Cortrak tube.   If the tube becomes dislodged please keep the tube and contact the Cortrak team at [email or web address] ([email or web address]) for replacement.  If after hours and replacement cannot be delayed, place a NG tube and confirm placement with an abdominal x-ray.    Wister Hoefle MS, RD, LDN Please refer to AMION for RD and/or RD on-call/weekend/after hours pager   

## 2021-09-01 NOTE — Progress Notes (Addendum)
Initial Nutrition Assessment  DOCUMENTATION CODES:   Not applicable  INTERVENTION:   Initiate tube feeding via Cortrak: Osmolite 1.5 at 20 ml/h, increase by 10 ml every 8 hours to goal rate of 70 ml/h (1680 ml per day).  Provides 2520 kcal, 105 gm protein, 1280 ml free water daily.  Continue to monitor magnesium, potassium, and phosphorus BID for at least 3 days, MD to replete as needed, as pt is at risk for refeeding syndrome given hx ETOH abuse.  Change liquid MVI to MVI with minerals crushed and given via tube once daily.  NUTRITION DIAGNOSIS:   Inadequate oral intake related to inability to eat as evidenced by NPO status.  GOAL:   Patient will meet greater than or equal to 90% of their needs  MONITOR:   TF tolerance, Labs  REASON FOR ASSESSMENT:   Consult Enteral/tube feeding initiation and management  ASSESSMENT:   51 yo male admitted with altered mental status in the setting of alcohol intoxication, acute hepatic encephalopathy. PMH includes polysubstance abuse, cirrhosis.  Discussed patient in ICU rounds and with RN today. GI following for GI bleed, which has resolved.  Cortrak placed this afternoon for lactulose administration; tip in the stomach per Cortrak reading; x-ray is pending. Patient with ongoing encephalopathy. Okay to begin TF per discussion with MD.   Labs reviewed. K 3.2, phos 1.7, mag 1.1 CBG: [phone]  Medications reviewed and include Colace, lactulose, liquid MVI, miralax, Klor-Con, thiamine, Precedex, mag sulfate, octreotide, Protonix.   No weight history available for review.  Patient is at increased nutrition risk d/t ETOH abuse. Suspect mental status will preclude adequate oral intake. Patient is at risk for refeeding syndrome d/t ETOH abuse, with low phos, K, and mag labs today.   NUTRITION - FOCUSED PHYSICAL EXAM:  Unable to complete  Diet Order:   Diet Order             Diet NPO time specified  Diet effective now                    EDUCATION NEEDS:   No education needs have been identified at this time  Skin:  Skin Assessment: Reviewed RN Assessment  Last BM:  PTA  Height:   Ht Readings from Last 1 Encounters:  09-13-21 5\' 8"  (1.727 m)    Weight:   Wt Readings from Last 1 Encounters:  09/01/21 82.1 kg    BMI:  Body mass index is 27.52 kg/m.  Estimated Nutritional Needs:   Kcal:  2400-2600  Protein:  84-105 gm  Fluid:  ~2 L    10/30/21 RD, LDN, CNSC Please refer to Amion for contact information.

## 2021-09-01 NOTE — Procedures (Addendum)
Intubation Procedure Note  Ziare Cryder  [id number]  Jun 27, 1971  Date:09/01/21  Time:3:37 PM   Provider Performing:Orbie Grupe    Procedure: Intubation (31500)  Indication(s) Respiratory Failure 2/2 to respiratory acidosis  Consent Unable to obtain consent due to emergent nature of procedure.   Anesthesia Etomidate    Time Out Verified patient identification, verified procedure, site/side was marked, verified correct patient position, special equipment/implants available, medications/allergies/relevant history reviewed, required imaging and test results available.   Sterile Technique Usual hand hygeine, masks, and gloves were used   Procedure Description Patient positioned in bed supine.  Sedation given as noted above.  Patient was intubated with endotracheal tube using Glidescope.  View was Grade 1 full glottis .  Number of attempts was 1.  Colorimetric CO2 detector was consistent with tracheal placement.   Complications/Tolerance None; patient tolerated the procedure well. Chest X-ray is ordered and pending to verify placement.   EBL Minimal  Specimen(s) None   Called and updated Sister, Guadlupe Spanish. She stated that she would be at the hospital soon to see patient.

## 2021-09-01 NOTE — Progress Notes (Addendum)
° °NAME:  Jeffrey Sullivan, MRN:  [id number], DOB:  12/31/1970, LOS: 2 °ADMISSION DATE:  09/21/2021, CONSULTATION DATE:  09/18/2021 °REFERRING MD:  [place]wain - EM , CHIEF COMPLAINT:  Acute metabolic encephalopathy   ° °History of Present Illness:  °50 yo M PMH cirrhosis, polysubstance abuse including EtOH abuse and meth abuse presented to [facility] ED via EMS with AMS after being found down, concerning for possible drug overdose. Pt was intubated for airway protection and limited history is available in the chart for review. ° °Pertinent  Medical History  °Substance use disorder °Cirrhosis ° °Significant Hospital Events: °Including procedures, antibiotic start and stop dates in addition to other pertinent events   °2/7 found down OOH, BIB EMS as suspected OD. Intubated in ED. Admitting to ICU  ° °Interim History / Subjective:  °Episode of MAP to 57- off of levo, no Bowel movements increase lactulose ° °Objective   °Blood pressure 95/72, pulse 83, temperature 98.2 °F (36.8 °C), resp. rate 20, height 5' 8" (1.727 m), weight 82.1 kg, SpO2 100 %. °   °Vent Mode: PRVC °FiO2 (%):  [40 %-50 %] 40 % °Set Rate:  [20 bmp] 20 bmp °Vt Set:  [550 mL] 550 mL °PEEP:  [5 cmH20] 5 cmH20 °Plateau Pressure:  [14 cmH20-22 cmH20] 16 cmH20  ° °Intake/Output Summary (Last 24 hours) at 09/01/2021 0602 °Last data filed at 09/01/2021 0500 °Gross per 24 hour  °Intake 4470.23 ml  °Output 2725 ml  °Net 1745.23 ml  ° ° °Filed Weights  ° 09/19/2021 2033 09/01/21 0500  °Weight: 90.7 kg 82.1 kg  ° ° °Examination: °General: Critically ill appearing middle aged M intubated, sedated  °HENT: NCAT ETT secure, Trachea midline  °Lungs: Coarse bilaterally. RUL rhonchi mechanically ventilated  °Cardiovascular: RRR, S1s2 no rgm  °Abdomen: soft ndnt + bowel sounds.  °Extremities: no acute joint deformity. No cyanosis or clubbing  °Neuro: Sedated, eyes closed, not following commands, moving all 4 extremities  °GU: foley, yellow urine  °Skin: scattered ecchymosis on legs.   ° °Afebrile °Hgb stable 8.0-7.9 °WBC 6.3 °Platelets 50-45 °K 3.2 °P 1.7 °Mg 1.1 °Iron 62, TIBC 213, ferritin 40 °Lactate 5.7 °AST 71-61 °Albumin 1.9-2.2 °Tbili 2.2-5.0 °Alk phos 163-111 °IgA 1090 °Ceruloplasmin 12.2 low °Alpha-1 wnl °AFP 3.9 °A1c 7.0 °US abd- perihepatic ascites, small right effusion ° °Resolved Hospital Problem list   ° ° °Assessment & Plan:  °Acute hepatic encephalopathy °EtOH abuse with acute alcohol intoxication  °Polysubstance abuse °no stool output overnight, increase lactulose to q 2 hrs °Continue CIWA scale °Continue thiamine and IV fluid °Watch for signs of withdrawal °SAT/ SBT this AM ° °Acute hypoxic respiratory failure requiring intubation °Small right sided pleural effusion  °Suspected aspiration pneumonia °Continue lung protective ventilation °SAT and SBT in the morning °PAD protocol with RASS goal 0/-1 °VAP, pulm hygiene  °Continue IV Unasyn, day 2 ° °Upper GIB °Acute on chronic blood loss anemia °Patient had bloody OGT output on presentation, no further episodes °This could be related to variceal bleeding, Hgb stable °Appreciate GI input, no EGD planned at this time °Continue octreotide and Protonix infusion °Monitor H&H °Transfuse if less than 7 ° °Acute alcoholic hepatitis °Decompensated Cirrhosis °Patient has long history of alcohol abuse. US showed cirrhotic changes. MELD 15, Child pugh class C °Tbili increased from 2.2 to 5.0. °Closely monitor °-Albumin °-Gi following appreciate recommendations, no EGD planned for now °-Unasyn day 2 °-follow-up on AFP, IgA, TTG, ceruloplasmin, mitochondrial, anti-smooth muscl, ANA, alpha-1 antitrypsin  ° °Lactic acidosis °AGMA °  NAGMA °Patient lactate is elevated, continue aggressive IV fluid resuscitation °Part of elevated lactate is acute hepatitis and liver is unable to convert lactate to bicarb. °-recheck lactate ° °Diabetes °Hgb A1c of 7.0. BG have been controlled between  140-180s. ° °Hypocalcemia/Hypomagnesemia/Hyponatremia/Hypokalemia °Continue aggressive electrolyte supplement. °-mg 1.1, K3.2, P 1.7 ° °Thrombocytopenia °Likely related to bone marrow suppression caused by alcohol °He was given 2 units FFP 2/7. Platelets at 45. ° °Best Practice (right click and "Reselect all SmartList Selections" daily)  ° °Diet/type: NPO °DVT prophylaxis: SCD °GI prophylaxis: PPI °Lines: N/A °Foley:  Yes, and it is still needed °Code Status:  full code °Last date of multidisciplinary goals of care discussion [2/8: Patient mother was updated, decision was to continue full scope of care] ° °Katie M. Masters, D.O.  °Internal Medicine Resident, PGY-1 °[facility]  °Pager: #[phone] °7:39 AM, 09/01/2021  ° °**Please contact the on call pager after 5 pm and on weekends at [phone].** ° °  ° ° °

## 2021-09-01 NOTE — Procedures (Signed)
Extubation Procedure Note  Patient Details:   Name: Ezariah Nace DOB: 01-07-1971 MRN: [id number]   Airway Documentation:    Vent end date: 09/01/21 Vent end time: 1320   Evaluation  O2 sats: stable throughout Complications: No apparent complications Patient did tolerate procedure well. Bilateral Breath Sounds: Clear, Diminished   Yes  Pt was extubated per MD order and placed on 3 L [place]. Cuff leak was noted prior to extubation and no stridor post. Pt is stable at this time. RT will monitor.   Merlene Laughter 09/01/2021, 1:22 PM

## 2021-09-01 NOTE — Progress Notes (Addendum)
eLink Physician-Brief Progress Note Patient Name: Jeffrey Sullivan DOB: February 21, 1971 MRN: [id number]   Date of Service  09/01/2021  HPI/Events of Note  Patient resting comfortably in bed at this time. Was reportedly uncomfortable earlier. PRN iv Fentanyl already on the [facility]  to be given only if indicated by patient discomfort.  eICU Interventions  See above        Migdalia Dk 09/01/2021, 9:55 PM

## 2021-09-02 ENCOUNTER — Encounter (HOSPITAL_COMMUNITY): Payer: Self-pay | Admitting: Certified Registered Nurse Anesthetist

## 2021-09-02 ENCOUNTER — Inpatient Hospital Stay (HOSPITAL_COMMUNITY): Payer: Medicaid Other

## 2021-09-02 DIAGNOSIS — E440 Moderate protein-calorie malnutrition: Secondary | ICD-10-CM | POA: Insufficient documentation

## 2021-09-02 LAB — CBC
HCT: 22.9 % — ABNORMAL LOW (ref 39.0–52.0)
Hemoglobin: 7.7 g/dL — ABNORMAL LOW (ref 13.0–17.0)
MCH: 31.6 pg (ref 26.0–34.0)
MCHC: 33.6 g/dL (ref 30.0–36.0)
MCV: 93.9 fL (ref 80.0–100.0)
Platelets: 46 10*3/uL — ABNORMAL LOW (ref 150–400)
RBC: 2.44 MIL/uL — ABNORMAL LOW (ref 4.22–5.81)
RDW: 15.5 % (ref 11.5–15.5)
WBC: 4.6 10*3/uL (ref 4.0–10.5)
nRBC: 0 % (ref 0.0–0.2)

## 2021-09-02 LAB — COMPREHENSIVE METABOLIC PANEL
ALT: 20 U/L (ref 0–44)
AST: 54 U/L — ABNORMAL HIGH (ref 15–41)
Albumin: 2.9 g/dL — ABNORMAL LOW (ref 3.5–5.0)
Alkaline Phosphatase: 90 U/L (ref 38–126)
Anion gap: 9 (ref 5–15)
BUN: 17 mg/dL (ref 6–20)
CO2: 19 mmol/L — ABNORMAL LOW (ref 22–32)
Calcium: 8.3 mg/dL — ABNORMAL LOW (ref 8.9–10.3)
Chloride: 116 mmol/L — ABNORMAL HIGH (ref 98–111)
Creatinine, Ser: 1.3 mg/dL — ABNORMAL HIGH (ref 0.61–1.24)
GFR, Estimated: >60 mL/min (ref >60)
Glucose, Bld: 245 mg/dL — ABNORMAL HIGH (ref 70–99)
Potassium: 3.4 mmol/L — ABNORMAL LOW (ref 3.5–5.1)
Sodium: 144 mmol/L (ref 135–145)
Total Bilirubin: 4.4 mg/dL — ABNORMAL HIGH (ref 0.3–1.2)
Total Protein: 6.1 g/dL — ABNORMAL LOW (ref 6.5–8.1)

## 2021-09-02 LAB — LACTIC ACID, PLASMA: Lactic Acid, Venous: 2.4 mmol/L (ref 0.5–1.9)

## 2021-09-02 LAB — MAGNESIUM
Magnesium: 2.4 mg/dL (ref 1.7–2.4)
Magnesium: 2.3 mg/dL (ref 1.7–2.4)

## 2021-09-02 LAB — GLUCOSE, CAPILLARY
Glucose-Capillary: 248 mg/dL — ABNORMAL HIGH (ref 70–99)
Glucose-Capillary: 235 mg/dL — ABNORMAL HIGH (ref 70–99)
Glucose-Capillary: 271 mg/dL — ABNORMAL HIGH (ref 70–99)
Glucose-Capillary: 275 mg/dL — ABNORMAL HIGH (ref 70–99)
Glucose-Capillary: 233 mg/dL — ABNORMAL HIGH (ref 70–99)
Glucose-Capillary: 186 mg/dL — ABNORMAL HIGH (ref 70–99)
Glucose-Capillary: 155 mg/dL — ABNORMAL HIGH (ref 70–99)
Glucose-Capillary: 174 mg/dL — ABNORMAL HIGH (ref 70–99)

## 2021-09-02 LAB — PHOSPHORUS
Phosphorus: 1.6 mg/dL — ABNORMAL LOW (ref 2.5–4.6)
Phosphorus: 4.8 mg/dL — ABNORMAL HIGH (ref 2.5–4.6)
Phosphorus: 4 mg/dL (ref 2.5–4.6)

## 2021-09-02 LAB — AFP TUMOR MARKER: AFP, Serum, Tumor Marker: 3.3 ng/mL (ref 0.0–6.9)

## 2021-09-02 MED ORDER — PROPOFOL 1000 MG/100ML IV EMUL
INTRAVENOUS | Status: AC
  Administered 2021-09-02: 10 ug/kg/min via INTRAVENOUS
  Filled 2021-09-02: qty 100

## 2021-09-02 MED ORDER — FENTANYL CITRATE (PF) 100 MCG/2ML IJ SOLN
INTRAMUSCULAR | Status: AC
  Filled 2021-09-02: qty 2

## 2021-09-02 MED ORDER — ETOMIDATE 2 MG/ML IV SOLN
INTRAVENOUS | Status: AC
  Administered 2021-09-02: 20 mg via INTRAVENOUS
  Filled 2021-09-02: qty 20

## 2021-09-02 MED ORDER — GABAPENTIN 250 MG/5ML PO SOLN
100.0000 mg | Freq: Three times a day (TID) | ORAL | Status: DC
  Administered 2021-09-02 (×2): 100 mg
  Filled 2021-09-02 (×5): qty 2

## 2021-09-02 MED ORDER — MIDAZOLAM HCL 2 MG/2ML IJ SOLN
INTRAMUSCULAR | Status: AC
  Filled 2021-09-02: qty 2

## 2021-09-02 MED ORDER — INSULIN ASPART 100 UNIT/ML IJ SOLN
0.0000 [IU] | INTRAMUSCULAR | Status: DC
  Administered 2021-09-02: 8 [IU] via SUBCUTANEOUS
  Administered 2021-09-02: 5 [IU] via SUBCUTANEOUS
  Administered 2021-09-02 (×2): 3 [IU] via SUBCUTANEOUS
  Administered 2021-09-03: 2 [IU] via SUBCUTANEOUS
  Administered 2021-09-03: 3 [IU] via SUBCUTANEOUS

## 2021-09-02 MED ORDER — PROPOFOL 1000 MG/100ML IV EMUL
5.0000 ug/kg/min | INTRAVENOUS | Status: DC
  Administered 2021-09-02 (×2): 20 ug/kg/min via INTRAVENOUS
  Administered 2021-09-03: 15 ug/kg/min via INTRAVENOUS
  Administered 2021-09-03: 30 ug/kg/min via INTRAVENOUS
  Filled 2021-09-02 (×2): qty 100

## 2021-09-02 MED ORDER — PANTOPRAZOLE SODIUM 40 MG IV SOLR
40.0000 mg | Freq: Two times a day (BID) | INTRAVENOUS | Status: DC
  Administered 2021-09-02 (×2): 40 mg via INTRAVENOUS
  Filled 2021-09-02 (×2): qty 10

## 2021-09-02 MED ORDER — LACTULOSE 10 GM/15ML PO SOLN
30.0000 g | Freq: Three times a day (TID) | ORAL | Status: DC
  Administered 2021-09-02 (×2): 30 g
  Filled 2021-09-02 (×2): qty 45

## 2021-09-02 MED ORDER — NOREPINEPHRINE 4 MG/250ML-% IV SOLN
2.0000 ug/min | INTRAVENOUS | Status: DC
  Administered 2021-09-02 – 2021-09-03 (×3): 2 ug/min via INTRAVENOUS
  Filled 2021-09-02: qty 500
  Filled 2021-09-02: qty 250

## 2021-09-02 MED ORDER — POTASSIUM CHLORIDE 20 MEQ PO PACK
20.0000 meq | PACK | Freq: Two times a day (BID) | ORAL | Status: AC
  Administered 2021-09-02 (×2): 20 meq
  Filled 2021-09-02 (×2): qty 1

## 2021-09-02 MED ORDER — ROCURONIUM BROMIDE 50 MG/5ML IV SOLN
80.0000 mg | Freq: Once | INTRAVENOUS | Status: AC
  Filled 2021-09-02: qty 8

## 2021-09-02 MED ORDER — SODIUM CHLORIDE 0.9 % IV SOLN
250.0000 mL | INTRAVENOUS | Status: DC
  Administered 2021-09-02: 250 mL via INTRAVENOUS

## 2021-09-02 MED ORDER — POTASSIUM PHOSPHATES 15 MMOLE/5ML IV SOLN
45.0000 mmol | Freq: Once | INTRAVENOUS | Status: AC
  Administered 2021-09-02: 45 mmol via INTRAVENOUS
  Filled 2021-09-02 (×2): qty 15

## 2021-09-02 MED ORDER — ROCURONIUM BROMIDE 10 MG/ML (PF) SYRINGE
PREFILLED_SYRINGE | INTRAVENOUS | Status: AC
  Administered 2021-09-02: 80 mg via INTRAVENOUS
  Filled 2021-09-02: qty 10

## 2021-09-02 MED ORDER — ETOMIDATE 2 MG/ML IV SOLN: 20.0000 mg | Freq: Once | INTRAVENOUS | Status: AC

## 2021-09-02 NOTE — Progress Notes (Addendum)
Nutrition Follow-up  DOCUMENTATION CODES:   Non-severe (moderate) malnutrition in context of chronic illness  INTERVENTION:   Continue tube feeding via Cortrak: Osmolite 1.5 at 40 ml/h, increase by 10 ml every 8 hours to goal rate of 70 ml/h (1680 ml per day).  Provides 2520 kcal, 105 gm protein, 1280 ml free water daily.  Continue to monitor magnesium, potassium, and phosphorus BID for at least 3 days, MD to replete as needed, as pt is at risk for refeeding syndrome given hx ETOH abuse.  Continue MVI with minerals via tube once daily.  NUTRITION DIAGNOSIS:   Moderate Malnutrition related to chronic illness (cirrhosis) as evidenced by mild fat depletion, mild muscle depletion.  GOAL:   Patient will meet greater than or equal to 90% of their needs  MONITOR:   TF tolerance, Labs  REASON FOR ASSESSMENT:   Consult Enteral/tube feeding initiation and management  ASSESSMENT:   51 yo male admitted with altered mental status in the setting of alcohol intoxication, acute hepatic encephalopathy. PMH includes polysubstance abuse, cirrhosis.  Discussed patient in ICU rounds and with RN today. Patient required re-intubation yesterday afternoon.  Cortrak placed 2/8 with tip in the distal stomach.  Currently receiving Osmolite 1.5 at 40 ml/h, increasing slowly d/t decline in refeeding labs. K and phos remain low. Mag WNL today. Supplementing with potassium phosphate today.  Plans for EGD tomorrow. TF to be stopped at midnight.   Labs reviewed. K 3.4, phos 1.6, mag 2.4 WNL CBG: [phone]  Medications reviewed and include Novolog, lactulose, MVI with minerals, Protonix, thiamine, Precedex, potassium phosphate.   Patient meets criteria for moderate malnutrition, given mild depletion of muscle and subcutaneous fat mass.  NUTRITION - FOCUSED PHYSICAL EXAM:  Flowsheet Row Most Recent Value  Orbital Region Mild depletion  Upper Arm Region Unable to assess  Thoracic and Lumbar  Region Mild depletion  Buccal Region Unable to assess  Temple Region Mild depletion  Clavicle Bone Region Mild depletion  Clavicle and Acromion Bone Region Mild depletion  Scapular Bone Region Unable to assess  Dorsal Hand Unable to assess  Patellar Region Mild depletion  Anterior Thigh Region Mild depletion  Posterior Calf Region Mild depletion  Edema (RD Assessment) Mild  Hair Reviewed  Eyes Unable to assess  Mouth Unable to assess  Skin Reviewed (jaundice)  Nails Unable to assess       Diet Order:   Diet Order             Diet NPO time specified  Diet effective now                   EDUCATION NEEDS:   No education needs have been identified at this time  Skin:  Skin Assessment: Reviewed RN Assessment  Last BM:  2/9  Height:   Ht Readings from Last 1 Encounters:  08/27/2021 5\' 8"  (1.727 m)    Weight:   Wt Readings from Last 1 Encounters:  09/02/21 82 kg    BMI:  Body mass index is 27.49 kg/m.  Estimated Nutritional Needs:   Kcal:  2400-2600  Protein:  100-120 gm  Fluid:  ~2 L    10/31/21 RD, LDN, CNSC Please refer to Amion for contact information.

## 2021-09-02 NOTE — Progress Notes (Signed)
eLink Physician-Brief Progress Note Patient Name: Jeffrey Sullivan DOB: 10/14/1970 MRN: [id number]   Date of Service  09/02/2021  HPI/Events of Note  Patient with sub-optimal sedation on the ventilator.  eICU Interventions  Fentanyl gtt ceiling increased.        [name] 09/02/2021, 12:39 AM

## 2021-09-02 NOTE — Progress Notes (Signed)
PT Cancellation Note  Patient Details Name: Jeffrey Sullivan MRN: [id number] DOB: 30-Apr-1971   Cancelled Treatment:    Reason Eval/Treat Not Completed: Patient not medically ready. Pt reintubated. Will continue to monitor for medical readiness.   [name] [facility] 09/02/2021, 12:15 PM Skip Mayer PT Acute Rehabilitation Services Pager [phone] Office [phone]

## 2021-09-02 NOTE — Procedures (Signed)
Self extubated. Saturating 60s on NRB. Fighting staff  Intubation Procedure Note  Diana Armijo  [id number]  1970/09/14  Date:09/02/21  Time:4:59 PM   Provider Performing:Lamyiah Crawshaw C Katrinka Blazing    Procedure: Intubation (31500)  Indication(s) Respiratory Failure  Consent Unable to obtain consent due to emergent nature of procedure.   Anesthesia Etomidate and Rocuronium   Time Out Verified patient identification, verified procedure, site/side was marked, verified correct patient position, special equipment/implants available, medications/allergies/relevant history reviewed, required imaging and test results available.   Sterile Technique Usual hand hygeine, masks, and gloves were used   Procedure Description Patient positioned in bed supine.  Sedation given as noted above.  Patient was intubated with endotracheal tube using Glidescope.  View was Grade 1 full glottis .  Number of attempts was 1.  Colorimetric CO2 detector was consistent with tracheal placement.   Complications/Tolerance None; patient tolerated the procedure well. Chest X-ray is ordered to verify placement.   EBL Minimal   Specimen(s) None

## 2021-09-02 NOTE — Progress Notes (Signed)
OT Cancellation Note  Patient Details Name: Jeffrey Sullivan MRN: [id number] DOB: July 06, 1971   Cancelled Treatment:    Reason Eval/Treat Not Completed: Patient not medically ready Pt emergently reintubated. Will follow-up at a later date for OT eval.  Lorre Munroe 09/02/2021, 12:42 PM

## 2021-09-02 NOTE — Progress Notes (Signed)
Spoke with nurse. Currently has working [facility]. Jeffrey Sullivan. RN VAST

## 2021-09-02 NOTE — Progress Notes (Addendum)
NAME:  Jeffrey Sullivan, MRN:  [id number], DOB:  April 29, 1971, LOS: 3 ADMISSION DATE:  08/29/2021, CONSULTATION DATE:  09/15/2021 REFERRING MD:  Judith Blonder - EM , CHIEF COMPLAINT:  Acute metabolic encephalopathy    History of Present Illness:  51 yo M PMH cirrhosis, polysubstance abuse including EtOH abuse and meth abuse presented to [facility] ED via EMS with AMS after being found down, admitted due to acute encephalopathy and intubated for airway protection.  Pertinent  Medical History  Substance use disorder Cirrhosis  Significant Hospital Events: Including procedures, antibiotic start and stop dates in addition to other pertinent events   2/7 found down OOH, BIB EMS as suspected OD. Intubated in ED. Admitted to ICU 2/8 Extubated then developed episodes of apnea and became unresponsive. Re-intubated.  Interim History / Subjective:  Fentanyl given overnight for patient discomfort, Stool output of 3L over last 24hrs.  Objective   Blood pressure 101/72, pulse 72, temperature (!) 97 F (36.1 C), resp. rate (!) 22, height 5\' 8"  (1.727 m), weight 82 kg, SpO2 100 %.    Vent Mode: PRVC FiO2 (%):  [40 %-100 %] 60 % Set Rate:  [20 bmp-30 bmp] 22 bmp Vt Set:  [550 mL] 550 mL PEEP:  [5 cmH20] 5 cmH20 Plateau Pressure:  [0.22 cmH20-17 cmH20] 16 cmH20   Intake/Output Summary (Last 24 hours) at 09/02/2021 [id number] Last data filed at 09/02/2021 0100 Gross per 24 hour  Intake 3529.26 ml  Output 2935 ml  Net 594.26 ml    Filed Weights   09/02/2021 2033 09/01/21 0500 09/02/21 0123  Weight: 90.7 kg 82.1 kg 82 kg    Examination: General: Critically ill appearing middle aged M intubated, sedated  HENT: NCAT ETT secure, pupils equal to light bilaterally  Lungs: Coarse bilaterally. Mechanically ventilated  Cardiovascular: RRR, S1s2 no rgm  Abdomen: soft ndnt + bowel sounds.  Extremities: no acute joint deformity. No cyanosis or clubbing  Neuro: Sedated, eyes closed, not following commands GU: foley, blood-tinged  urine Skin: scattered ecchymosis on legs.   Repeat ABG pH 7.5/ CO2 26.6/ O2 75/ HCO3 23.5 Hgb 8.5-7.7 Platelets 46 Mg 2.4, P 1.6, K 3.4 Creatinine 0.94-1.3 Glucose 248 Tbili 4.4 AST 61-54  Resolved Hospital Problem list   AGMA-resolved Lactic acidosis NAGMA  Assessment & Plan:  Acute hepatic encephalopathy EtOH abuse with acute alcohol intoxication  Polysubstance abuse 3L stool output in last 24 hours. Decrease Lactulose to q 8 hrs. Continue CIWA scale Continue thiamine and IV fluid Watch for signs of withdrawal SAT/ SBT this AM  Acute hypoxic respiratory failure requiring intubation Small right sided pleural effusion  Aspiration pneumonia Continue lung protective ventilation -SAT and SBT in the morning -PAD protocol with RASS goal 0/-1 -VAP, pulm hygiene  -Continue IV Unasyn, day 3  Upper GIB Acute on chronic blood loss anemia Hgb trended down to 7.7 this AM. Iron studies not consistent with iron deficiency anemia.  -DC octreotide and IV Protonix BID -Monitor H&H -Transfuse if less than 7  Acute alcoholic hepatitis Decompensated Cirrhosis Patient has long history of alcohol abuse. US showed cirrhotic changes. MELD 15, Child pugh class C Tbili decreased from 5-4.4 -Albumin -GI following appreciate recommendations, no EGD planned for now -Unasyn day 3  AKI Creatinine 0.94-1.3. This is likely 2/2 to high output of stool. -Avoid nephrotoxic medications -Renally dose medications -trend creatinine  Hematuria UA on presentation with hemoglobin, he has gross hematuria while in unit. Clots present in foley catheter concerning for nonglomerular etiology.   Diabetes  Hgb A1c of 7.0, new diagnosis. BG elevated into 200s.  -SSI -BG q 4 hrs  Hypocalcemia/Hypomagnesemia/Hyponatremia/Hypokalemia Continue aggressive electrolyte supplement. -Mg 2.4, K 3.4, P 1.6  Thrombocytopenia Likely related to bone marrow suppression caused by alcohol He was given 2 units FFP  2/7. Platelets at 46.  Best Practice (right click and "Reselect all SmartList Selections" daily)   Diet/type: NPO DVT prophylaxis: SCD GI prophylaxis: PPI Lines: N/A Foley:  Yes, and it is still needed Code Status:  full code Last date of multidisciplinary goals of care discussion [2/8: Patient mother was updated, decision was to continue full scope of care]  Jeffrey Sullivan M. Fredi Geiler, D.O.  Internal Medicine Resident, PGY-1 [facility]  Pager: [phone] 6:19 AM, 09/02/2021   **Please contact the on call pager after 5 pm and on weekends at [phone].**

## 2021-09-02 NOTE — Progress Notes (Signed)
eLink Physician-Brief Progress Note Patient Name: Massey Ruhland DOB: Dec 15, 1970 MRN: [id number]   Date of Service  09/02/2021  HPI/Events of Note  Patient is hypothermic (Temp 94.5)  eICU Interventions  Bear Hugger ordered.        Migdalia Dk 09/02/2021, 8:38 PM

## 2021-09-02 NOTE — Progress Notes (Addendum)
Progress Note Hospital Day: 4  Chief Complaint:         ASSESSMENT AND PLAN   Brief History:  51 yo male admitted with history of Etoh abuse, cirrhosis on [place].  MELD 17.  Admitted 2/6 with AMS.  Acute encephalopathy possibly from acute Etoh intoxication vr hepatic encephalopathy vrs drug OD ( drug not captured on UDS?) --08/31/21  UDS is negative. Etoh level 280. Ammonia 89. 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 Continue Unasyn ( treating for possible aspiration PNA). If this is stopped then should give Rocephin for SBP prophylaxis. Continue PPI infusion. Continue Octreotide infusion for now. Add lactulose at some point. Timing of EGD for varices screening to be determined.   --09/01/21 Extubated a few minutes ago. Drowsy. Woke up startled appearing during exam. Cirrhosis likely related to Etoh but complete serologic evaluation ordered. ANA, ASMA, AMA, A-1 Antitrypsin all negative. Ceruloplasmin low at 12, Ferritin 40, tTg minimally elevated at 4.  [place] screening: No focal liver lesions reported on [place]. AFP normal. PLAN:  Continue Lactulose. Timing of EGD to be determined.  Will need outpatient HAV, HBV vaccinations --09/02/21 Had to be reintubated yesterday after periods of apnea. ABGs showed pH of 6.99.  Having liquids stool in flexiseal, on lactulose. He is still on Unasyn for possible aspiration, hopefully this would cover him for SBP prophylaxis but risk of SBP probably not great given that he has only a small amount of perihepatic ascites. PLAN: 1. Due to low ceruloplasmin will obtain 24 hour urine copper as getting Ophthalmology here for slit lamp exam may be difficult. 2. Will plan for EGD at bedside tomorrow for varices screening. The risks and benefits of EGD with possible biopsies were discussed with the patient's mother who agrees to proceed. 3. Continue BID PPI  4. Octreotide was discontinued by PCCM   #   anemia / ? GI bleed. Baseline hgb unknown though it was in 7 range at ED visit in July 2022. This admission he presented with hgb of 10.5 but it subsequently declined requiring two units of PRBC on 08/31/21.    --Hgb stable at 7.7 post 2 u PRBC on 08/31/21.      OBJECTIVE      Scheduled inpatient medications:   chlorhexidine gluconate (MEDLINE KIT)  15 mL Mouth Rinse BID   Chlorhexidine Gluconate Cloth  6 each Topical UD   docusate  100 mg Per Tube BID   gabapentin  100 mg Per Tube Q8H   insulin aspart  0-15 Units Subcutaneous Q4H   lactulose  30 g Per Tube Q8H   mouth rinse  15 mL Mouth Rinse 10 times per day   multivitamin with minerals  1 tablet Per Tube Daily   mupirocin ointment  1 application Nasal BID   pantoprazole (PROTONIX) IV  40 mg Intravenous Q12H   polyethylene glycol  17 g Per Tube Daily   thiamine injection  100 mg Intravenous Daily   Continuous inpatient infusions:   ampicillin-sulbactam (UNASYN) IV 200 mL/hr at 09/02/21 1200   dexmedetomidine (PRECEDEX) IV infusion 1.2 mcg/kg/hr (09/02/21 1200)   feeding supplement (OSMOLITE 1.5 CAL) 40 mL/hr at 09/02/21 0900   fentaNYL infusion INTRAVENOUS 200 mcg/hr (09/02/21 1200)   potassium PHOSPHATE IVPB (in mmol) 64.4 mL/hr at 09/02/21 1200   PRN inpatient medications: diphenhydrAMINE, docusate, fentaNYL, ondansetron (ZOFRAN) IV, polyethylene glycol  Vital signs in last 24 hours: Temp:  [95.4  F (35.2 C)-97.7 F (36.5 C)] 96.6 F (35.9 C) (02/09 1200) Pulse Rate:  [68-116] 68 (02/09 1200) Resp:  [0-34] 22 (02/09 1200) BP: (74-147)/(50-99) 116/77 (02/09 1200) SpO2:  [86 %-100 %] 100 % (02/09 1200) FiO2 (%):  [40 %-100 %] 40 % (02/09 1200) Weight:  [82 kg] 82 kg (02/09 0123) Last BM Date: 09/02/21  Intake/Output Summary (Last 24 hours) at 09/02/2021 1224 Last data filed at 09/02/2021 1200 Gross per 24 hour  Intake 4222.86 ml  Output 4640 ml  Net -417.14 ml     Physical Exam:  General: Alert male , restless .  Intubated on vent. Mother at bedside Heart:  Regular rate and rhythm, 1+ BLE edema Pulmonary: intubated on vent. No wheezing.  Abdomen: Soft, nondistended, nontender. Normal bowel sounds.  Neurologic: Alert and oriented Psych: Pleasant. Cooperative.   Filed Weights   08/29/2021 2033 09/01/21 0500 09/02/21 0123  Weight: 90.7 kg 82.1 kg 82 kg    ntake/Output from previous day: 02/08 0701 - 02/09 0700 In: 4332.3 [I.V.:1929.4; NG/GT:864; IV Piggyback:1538.9] Out: 4750 [Urine:1950; LOVFI:4332] Intake/Output this shift: Total I/O In: 753.1 [I.V.:401.1; NG/GT:180; IV Piggyback:172] Out: 340 [Urine:340]   DIAGNOSTIC STUDIES THIS ADMISSION:  Portable Chest x-ray  Result Date: 09/01/2021 CLINICAL DATA:  Intubated EXAM: PORTABLE CHEST 1 VIEW COMPARISON:  09/04/2021 FINDINGS: Endotracheal tube tip is about 2.8 cm superior to the carina. Esophageal tube tip below the diaphragm but incompletely visualized. Interval opacity at the left lung base. Suspect fluid level in the right lower chest. No visible pneumothorax. Stable cardiomediastinal silhouette. Increased vascular congestion and development of hazy perihilar opacity likely edema. IMPRESSION: 1. Endotracheal tube tip about 2.8 cm superior to carina 2. Increased bilateral effusions airspace disease at the lung bases. Increased vascular congestion with development of hazy perihilar opacity likely due to edema. Electronically Signed   By: Donavan Foil M.D.   On: 09/01/2021 16:58   DG Abd Portable 1V  Result Date: 09/01/2021 CLINICAL DATA:  Feeding tube EXAM: PORTABLE ABDOMEN - 1 VIEW COMPARISON:  None. FINDINGS: Enteric tube tip overlies the distal stomach. Upper gas pattern is unremarkable IMPRESSION: Enteric tube tip overlies the distal stomach Electronically Signed   By: Donavan Foil M.D.   On: 09/01/2021 16:48       Lab Results: Recent Labs    08/31/21 0852 09/01/21 0626 09/01/21 1519 09/01/21 1645 09/02/21 0426  WBC 6.5 6.3  --   --   4.6  HGB 8.0* 7.9* 9.5* 8.5* 7.7*  HCT 24.1* 23.1* 28.0* 25.0* 22.9*  PLT 50* 45*  --   --  46*   BMET Recent Labs    09/01/21 0626 09/01/21 1519 09/01/21 1645 09/01/21 1836 09/02/21 0426  NA 138   < > 145 141 144  K 3.2*   < > 3.5 4.0 3.4*  CL 108  --   --  111 116*  CO2 20*  --   --  19* 19*  GLUCOSE 172*  --   --  192* 245*  BUN 13  --   --  14 17  CREATININE 0.94  --   --  1.16 1.30*  CALCIUM 7.7*  --   --  8.2* 8.3*   < > = values in this interval not displayed.   LFT Recent Labs    09/02/21 0426  PROT 6.1*  ALBUMIN 2.9*  AST 54*  ALT 20  ALKPHOS 90  BILITOT 4.4*   PT/INR Recent Labs    09/09/2021 2045 08/31/21 0405  LABPROT 26.7* 18.8*  INR 2.5* 1.6*   Hepatitis Panel Recent Labs    09/16/2021 2045  HEPBSAG NON REACTIVE  HCVAB NON REACTIVE  HEPAIGM NON REACTIVE  HEPBIGM NON REACTIVE      Principal Problem:   Acute encephalopathy Active Problems:   Malnutrition of moderate degree     LOS: 3 days   Tye Savoy ,NP 09/02/2021, 12:24 PM   I have taken a history, reviewed the chart and examined the patient. I performed a substantive portion of this encounter, including complete performance of at least one of the key components, in conjunction with the APP. I agree with the APP's note, impression and recommendations  Patient was extubated yesterday,  but then subsequently reintubated with oxygenation and agitation concerns.  His hgb has dwindled but without evidence of bleeding, and his renal function has declined over the past 48 hours.  I had the chance to speak with his mother this morning who tells that the patient had told her that he had been having trouble with tremors and was concerned for Parkinson's.  This further increases suspicion for Wilson disease in the setting of low ceruloplasmin.  Given the low probability of a good slit lamp examination in an intubated/sedated patient in the ICU, will proceed with a 24 hour urine copper study to  further assess for Wilson disease. Also, will plan to proceed with variceal screening tomorrow while patient is still intubated.  This procedure was discussed with the patient's mother who consented for the patient.  Please hold tube feeds starting at midnight tonight.  Agree with albumin to help AKI.  Continue lactulose, titrating to 2 Bms/day.  Continue PPI BID, agree with cessation of octreotide.  Casmer Yepiz E. Candis Schatz, MD [facility]

## 2021-09-03 ENCOUNTER — Inpatient Hospital Stay (HOSPITAL_COMMUNITY): Payer: Medicaid Other

## 2021-09-03 ENCOUNTER — Encounter (HOSPITAL_COMMUNITY): Admission: EM | Disposition: E | Payer: Self-pay | Source: Home / Self Care | Attending: Internal Medicine

## 2021-09-03 ENCOUNTER — Encounter (HOSPITAL_COMMUNITY): Payer: Self-pay | Admitting: Pulmonary Disease

## 2021-09-03 DIAGNOSIS — F10229 Alcohol dependence with intoxication, unspecified: Secondary | ICD-10-CM

## 2021-09-03 DIAGNOSIS — E8809 Other disorders of plasma-protein metabolism, not elsewhere classified: Secondary | ICD-10-CM

## 2021-09-03 DIAGNOSIS — R578 Other shock: Secondary | ICD-10-CM

## 2021-09-03 DIAGNOSIS — K567 Ileus, unspecified: Secondary | ICD-10-CM

## 2021-09-03 DIAGNOSIS — E119 Type 2 diabetes mellitus without complications: Secondary | ICD-10-CM

## 2021-09-03 DIAGNOSIS — D696 Thrombocytopenia, unspecified: Secondary | ICD-10-CM

## 2021-09-03 DIAGNOSIS — R791 Abnormal coagulation profile: Secondary | ICD-10-CM

## 2021-09-03 DIAGNOSIS — N179 Acute kidney failure, unspecified: Secondary | ICD-10-CM

## 2021-09-03 DIAGNOSIS — K7010 Alcoholic hepatitis without ascites: Secondary | ICD-10-CM

## 2021-09-03 DIAGNOSIS — R319 Hematuria, unspecified: Secondary | ICD-10-CM

## 2021-09-03 DIAGNOSIS — K3189 Other diseases of stomach and duodenum: Secondary | ICD-10-CM

## 2021-09-03 DIAGNOSIS — K269 Duodenal ulcer, unspecified as acute or chronic, without hemorrhage or perforation: Secondary | ICD-10-CM

## 2021-09-03 DIAGNOSIS — R6521 Severe sepsis with septic shock: Secondary | ICD-10-CM

## 2021-09-03 DIAGNOSIS — K922 Gastrointestinal hemorrhage, unspecified: Secondary | ICD-10-CM

## 2021-09-03 DIAGNOSIS — E870 Hyperosmolality and hypernatremia: Secondary | ICD-10-CM

## 2021-09-03 DIAGNOSIS — K7460 Unspecified cirrhosis of liver: Secondary | ICD-10-CM

## 2021-09-03 DIAGNOSIS — E876 Hypokalemia: Secondary | ICD-10-CM

## 2021-09-03 DIAGNOSIS — J690 Pneumonitis due to inhalation of food and vomit: Secondary | ICD-10-CM

## 2021-09-03 DIAGNOSIS — J9602 Acute respiratory failure with hypercapnia: Secondary | ICD-10-CM

## 2021-09-03 DIAGNOSIS — J9601 Acute respiratory failure with hypoxia: Secondary | ICD-10-CM

## 2021-09-03 DIAGNOSIS — A419 Sepsis, unspecified organism: Secondary | ICD-10-CM

## 2021-09-03 HISTORY — PX: ESOPHAGOGASTRODUODENOSCOPY: SHX5428

## 2021-09-03 HISTORY — PX: HEMOSTASIS CONTROL: SHX6838

## 2021-09-03 LAB — BPAM PLATELET PHERESIS
Blood Product Expiration Date: 202302122359
Blood Product Expiration Date: 202302122359
Unit Type and Rh: 6200
Unit Type and Rh: 6200

## 2021-09-03 LAB — COMPREHENSIVE METABOLIC PANEL
ALT: 23 U/L (ref 0–44)
ALT: 19 U/L (ref 0–44)
AST: 53 U/L — ABNORMAL HIGH (ref 15–41)
AST: 61 U/L — ABNORMAL HIGH (ref 15–41)
Albumin: 2.8 g/dL — ABNORMAL LOW (ref 3.5–5.0)
Albumin: 2.4 g/dL — ABNORMAL LOW (ref 3.5–5.0)
Alkaline Phosphatase: 113 U/L (ref 38–126)
Alkaline Phosphatase: 188 U/L — ABNORMAL HIGH (ref 38–126)
Anion gap: 11 (ref 5–15)
Anion gap: 16 — ABNORMAL HIGH (ref 5–15)
BUN: 22 mg/dL — ABNORMAL HIGH (ref 6–20)
BUN: 23 mg/dL — ABNORMAL HIGH (ref 6–20)
CO2: 18 mmol/L — ABNORMAL LOW (ref 22–32)
CO2: 16 mmol/L — ABNORMAL LOW (ref 22–32)
Calcium: 8.3 mg/dL — ABNORMAL LOW (ref 8.9–10.3)
Calcium: 7.7 mg/dL — ABNORMAL LOW (ref 8.9–10.3)
Chloride: 119 mmol/L — ABNORMAL HIGH (ref 98–111)
Chloride: 118 mmol/L — ABNORMAL HIGH (ref 98–111)
Creatinine, Ser: 1.85 mg/dL — ABNORMAL HIGH (ref 0.61–1.24)
Creatinine, Ser: 2.42 mg/dL — ABNORMAL HIGH (ref 0.61–1.24)
GFR, Estimated: 44 mL/min — ABNORMAL LOW (ref >60)
GFR, Estimated: 32 mL/min — ABNORMAL LOW (ref >60)
Glucose, Bld: 151 mg/dL — ABNORMAL HIGH (ref 70–99)
Glucose, Bld: 117 mg/dL — ABNORMAL HIGH (ref 70–99)
Potassium: 3.4 mmol/L — ABNORMAL LOW (ref 3.5–5.1)
Potassium: 2.9 mmol/L — ABNORMAL LOW (ref 3.5–5.1)
Sodium: 148 mmol/L — ABNORMAL HIGH (ref 135–145)
Sodium: 150 mmol/L — ABNORMAL HIGH (ref 135–145)
Total Bilirubin: 5.3 mg/dL — ABNORMAL HIGH (ref 0.3–1.2)
Total Bilirubin: 5.3 mg/dL — ABNORMAL HIGH (ref 0.3–1.2)
Total Protein: 6.3 g/dL — ABNORMAL LOW (ref 6.5–8.1)
Total Protein: 5.3 g/dL — ABNORMAL LOW (ref 6.5–8.1)

## 2021-09-03 LAB — CBC
HCT: 25.8 % — ABNORMAL LOW (ref 39.0–52.0)
HCT: 30.7 % — ABNORMAL LOW (ref 39.0–52.0)
Hemoglobin: 8.1 g/dL — ABNORMAL LOW (ref 13.0–17.0)
Hemoglobin: 9.5 g/dL — ABNORMAL LOW (ref 13.0–17.0)
MCH: 30.9 pg (ref 26.0–34.0)
MCH: 31.6 pg (ref 26.0–34.0)
MCHC: 31.4 g/dL (ref 30.0–36.0)
MCHC: 30.9 g/dL (ref 30.0–36.0)
MCV: 98.5 fL (ref 80.0–100.0)
MCV: 102 fL — ABNORMAL HIGH (ref 80.0–100.0)
Platelets: 69 10*3/uL — ABNORMAL LOW (ref 150–400)
Platelets: 69 10*3/uL — ABNORMAL LOW (ref 150–400)
RBC: 2.62 MIL/uL — ABNORMAL LOW (ref 4.22–5.81)
RBC: 3.01 MIL/uL — ABNORMAL LOW (ref 4.22–5.81)
RDW: 16.3 % — ABNORMAL HIGH (ref 11.5–15.5)
RDW: 16.5 % — ABNORMAL HIGH (ref 11.5–15.5)
WBC: 4.4 10*3/uL (ref 4.0–10.5)
WBC: 2.2 10*3/uL — ABNORMAL LOW (ref 4.0–10.5)
nRBC: 0 % (ref 0.0–0.2)
nRBC: 4.5 % — ABNORMAL HIGH (ref 0.0–0.2)

## 2021-09-03 LAB — PREPARE PLATELET PHERESIS
Unit division: 0
Unit division: 0

## 2021-09-03 LAB — POCT I-STAT 7, (LYTES, BLD GAS, ICA,H+H)
Acid-base deficit: 8 mmol/L — ABNORMAL HIGH (ref 0.0–2.0)
Bicarbonate: 19.4 mmol/L — ABNORMAL LOW (ref 20.0–28.0)
Calcium, Ion: 1.15 mmol/L (ref 1.15–1.40)
HCT: 21 % — ABNORMAL LOW (ref 39.0–52.0)
Hemoglobin: 7.1 g/dL — ABNORMAL LOW (ref 13.0–17.0)
O2 Saturation: 79 %
Patient temperature: 97.6
Potassium: 2.7 mmol/L — CL (ref 3.5–5.1)
Sodium: 156 mmol/L — ABNORMAL HIGH (ref 135–145)
TCO2: 21 mmol/L — ABNORMAL LOW (ref 22–32)
pCO2 arterial: 44.7 mmHg (ref 32.0–48.0)
pH, Arterial: 7.242 — ABNORMAL LOW (ref 7.350–7.450)
pO2, Arterial: 50 mmHg — ABNORMAL LOW (ref 83.0–108.0)

## 2021-09-03 LAB — TRIGLYCERIDES: Triglycerides: 101 mg/dL (ref <150)

## 2021-09-03 LAB — MAGNESIUM: Magnesium: 2.2 mg/dL (ref 1.7–2.4)

## 2021-09-03 LAB — GLUCOSE, CAPILLARY
Glucose-Capillary: 146 mg/dL — ABNORMAL HIGH (ref 70–99)
Glucose-Capillary: 120 mg/dL — ABNORMAL HIGH (ref 70–99)
Glucose-Capillary: 104 mg/dL — ABNORMAL HIGH (ref 70–99)

## 2021-09-03 LAB — VITAMIN B12: Vitamin B-12: 2750 pg/mL — ABNORMAL HIGH (ref 180–914)

## 2021-09-03 LAB — PROTIME-INR
INR: 3.1 — ABNORMAL HIGH (ref 0.8–1.2)
Prothrombin Time: 32.1 seconds — ABNORMAL HIGH (ref 11.4–15.2)

## 2021-09-03 LAB — PHOSPHORUS: Phosphorus: 3.3 mg/dL (ref 2.5–4.6)

## 2021-09-03 LAB — PREPARE RBC (CROSSMATCH)

## 2021-09-03 LAB — FOLATE: Folate: 10.6 ng/mL (ref >5.9)

## 2021-09-03 LAB — LACTATE DEHYDROGENASE: LDH: 197 U/L — ABNORMAL HIGH (ref 98–192)

## 2021-09-03 SURGERY — ESOPHAGOGASTRODUODENOSCOPY (EGD) WITH PROPOFOL
Anesthesia: Moderate Sedation

## 2021-09-03 SURGERY — EGD (ESOPHAGOGASTRODUODENOSCOPY)
Anesthesia: Monitor Anesthesia Care

## 2021-09-03 MED ORDER — ACETAMINOPHEN 325 MG PO TABS: 650.0000 mg | ORAL_TABLET | Freq: Four times a day (QID) | ORAL | Status: DC | PRN

## 2021-09-03 MED ORDER — VASOPRESSIN 20 UNITS/100 ML INFUSION FOR SHOCK
0.0000 [IU]/min | INTRAVENOUS | Status: DC
  Administered 2021-09-03: 0.04 [IU]/min via INTRAVENOUS

## 2021-09-03 MED ORDER — POLYVINYL ALCOHOL 1.4 % OP SOLN
1.0000 [drp] | Freq: Four times a day (QID) | OPHTHALMIC | Status: DC | PRN
  Filled 2021-09-03: qty 15

## 2021-09-03 MED ORDER — GLYCOPYRROLATE 1 MG PO TABS: 1.0000 mg | ORAL_TABLET | ORAL | Status: DC | PRN

## 2021-09-03 MED ORDER — ONDANSETRON HCL 4 MG/2ML IJ SOLN: 4.0000 mg | Freq: Four times a day (QID) | INTRAMUSCULAR | Status: DC | PRN

## 2021-09-03 MED ORDER — EPINEPHRINE HCL 5 MG/250ML IV SOLN IN NS
INTRAVENOUS | Status: AC
  Filled 2021-09-03: qty 250

## 2021-09-03 MED ORDER — ALBUMIN HUMAN 5 % IV SOLN
12.5000 g | Freq: Once | INTRAVENOUS | Status: AC
  Administered 2021-09-03: 12.5 g via INTRAVENOUS
  Filled 2021-09-03: qty 250

## 2021-09-03 MED ORDER — SODIUM BICARBONATE 8.4 % IV SOLN
INTRAVENOUS | Status: AC
  Filled 2021-09-03: qty 100

## 2021-09-03 MED ORDER — EPINEPHRINE 1 MG/10ML IJ SOSY
PREFILLED_SYRINGE | INTRAMUSCULAR | Status: AC
  Filled 2021-09-03: qty 10

## 2021-09-03 MED ORDER — MORPHINE SULFATE (PF) 2 MG/ML IV SOLN: 2.0000 mg | INTRAVENOUS | Status: DC | PRN

## 2021-09-03 MED ORDER — ACETAMINOPHEN 650 MG RE SUPP: 650.0000 mg | Freq: Four times a day (QID) | RECTAL | Status: DC | PRN

## 2021-09-03 MED ORDER — SODIUM CHLORIDE 0.9% IV SOLUTION: Freq: Once | INTRAVENOUS | Status: DC

## 2021-09-03 MED ORDER — MORPHINE 100MG IN NS 100ML (1MG/ML) PREMIX INFUSION
0.0000 mg/h | INTRAVENOUS | Status: DC
  Administered 2021-09-03: 5 mg/h via INTRAVENOUS
  Filled 2021-09-03 (×2): qty 100

## 2021-09-03 MED ORDER — SODIUM BICARBONATE 8.4 % IV SOLN
INTRAVENOUS | Status: AC
  Filled 2021-09-03: qty 50

## 2021-09-03 MED ORDER — EPINEPHRINE HCL 5 MG/250ML IV SOLN IN NS
0.5000 ug/min | INTRAVENOUS | Status: DC
  Administered 2021-09-03: 2 ug/min via INTRAVENOUS
  Filled 2021-09-03: qty 250

## 2021-09-03 MED ORDER — DEXTROSE 5 % IV SOLN: INTRAVENOUS | Status: DC

## 2021-09-03 MED ORDER — MORPHINE BOLUS VIA INFUSION
5.0000 mg | INTRAVENOUS | Status: DC | PRN
  Administered 2021-09-03: 5 mg via INTRAVENOUS
  Filled 2021-09-03: qty 5

## 2021-09-03 MED ORDER — DIPHENHYDRAMINE HCL 50 MG/ML IJ SOLN: 25.0000 mg | INTRAMUSCULAR | Status: DC | PRN

## 2021-09-03 MED ORDER — POTASSIUM CHLORIDE 10 MEQ/100ML IV SOLN: 10.0000 meq | Freq: Once | INTRAVENOUS | Status: DC

## 2021-09-03 MED ORDER — MIDAZOLAM HCL 2 MG/2ML IJ SOLN
2.0000 mg | INTRAMUSCULAR | Status: DC | PRN
  Administered 2021-09-03: 4 mg via INTRAVENOUS
  Filled 2021-09-03: qty 4

## 2021-09-03 MED ORDER — POTASSIUM CHLORIDE 10 MEQ/50ML IV SOLN: 10.0000 meq | INTRAVENOUS | Status: DC

## 2021-09-03 MED ORDER — GLYCOPYRROLATE 0.2 MG/ML IJ SOLN: 0.2000 mg | INTRAMUSCULAR | Status: DC | PRN

## 2021-09-03 MED ORDER — GLYCOPYRROLATE 0.2 MG/ML IJ SOLN
0.2000 mg | INTRAMUSCULAR | Status: DC | PRN
  Administered 2021-09-03: 0.2 mg via INTRAVENOUS
  Filled 2021-09-03: qty 1

## 2021-09-03 MED ORDER — NOREPINEPHRINE 4 MG/250ML-% IV SOLN
0.0000 ug/min | INTRAVENOUS | Status: DC
  Administered 2021-09-03 (×3): 60 ug/min via INTRAVENOUS
  Filled 2021-09-03: qty 250
  Filled 2021-09-03: qty 500

## 2021-09-03 MED ORDER — POTASSIUM CHLORIDE 10 MEQ/100ML IV SOLN: 10.0000 meq | INTRAVENOUS | Status: DC

## 2021-09-03 MED ORDER — MIDAZOLAM HCL (PF) 5 MG/ML IJ SOLN
INTRAMUSCULAR | Status: AC
  Filled 2021-09-03: qty 2

## 2021-09-03 MED ORDER — FENTANYL CITRATE (PF) 100 MCG/2ML IJ SOLN
INTRAMUSCULAR | Status: AC
  Filled 2021-09-03: qty 4

## 2021-09-03 MED ORDER — ONDANSETRON 4 MG PO TBDP: 4.0000 mg | ORAL_TABLET | Freq: Four times a day (QID) | ORAL | Status: DC | PRN

## 2021-09-03 MED ORDER — SODIUM CHLORIDE 0.9 % IV SOLN: 3.0000 g | Freq: Three times a day (TID) | INTRAVENOUS | Status: DC

## 2021-09-03 MED ORDER — SPOT INK MARKER SYRINGE KIT
PACK | SUBMUCOSAL | Status: AC
  Filled 2021-09-03: qty 5

## 2021-09-03 MED ORDER — POTASSIUM CHLORIDE 10 MEQ/100ML IV SOLN
10.0000 meq | INTRAVENOUS | Status: DC
  Administered 2021-09-03: 10 meq via INTRAVENOUS
  Filled 2021-09-03: qty 100

## 2021-09-03 MED ORDER — SODIUM CHLORIDE 0.9 % IV SOLN
50.0000 ug/h | INTRAVENOUS | Status: DC
  Administered 2021-09-03: 50 ug/h via INTRAVENOUS
  Filled 2021-09-03: qty 1

## 2021-09-04 LAB — PREPARE FRESH FROZEN PLASMA: Unit division: 0

## 2021-09-04 LAB — TYPE AND SCREEN
ABO/RH(D): B POS
Antibody Screen: NEGATIVE
Unit division: 0
Unit division: 0
Unit division: 0
Unit division: 0
Unit division: 0
Unit division: 0

## 2021-09-04 LAB — BPAM RBC
Blood Product Expiration Date: 202302222359
Blood Product Expiration Date: 202302232359
Blood Product Expiration Date: 202302262359
Blood Product Expiration Date: 202302272359
Blood Product Expiration Date: 202302282359
Blood Product Expiration Date: 202302282359
ISSUE DATE / TIME: 202302100949
ISSUE DATE / TIME: 202302100949
ISSUE DATE / TIME: 202302101134
ISSUE DATE / TIME: 202302101633
Unit Type and Rh: 7300
Unit Type and Rh: 7300
Unit Type and Rh: 7300
Unit Type and Rh: 7300
Unit Type and Rh: 7300
Unit Type and Rh: 7300

## 2021-09-04 LAB — CULTURE, RESPIRATORY W GRAM STAIN
Culture: NORMAL
Gram Stain: NONE SEEN

## 2021-09-04 LAB — PREPARE PLATELET PHERESIS
Unit division: 0
Unit division: 0

## 2021-09-04 LAB — BPAM FFP
Blood Product Expiration Date: 202302152359
Blood Product Expiration Date: 202302152359
Blood Product Expiration Date: 202302142359
Blood Product Expiration Date: 202302142359
ISSUE DATE / TIME: 202302101003
ISSUE DATE / TIME: 202302101003
Unit Type and Rh: 7300
Unit Type and Rh: 7300
Unit Type and Rh: 6200
Unit Type and Rh: 6200

## 2021-09-04 LAB — BPAM PLATELET PHERESIS
Blood Product Expiration Date: 202302102359
Blood Product Expiration Date: 202302102359
ISSUE DATE / TIME: 202302101000
ISSUE DATE / TIME: 202302101000
Unit Type and Rh: 1700
Unit Type and Rh: 6200

## 2021-09-05 ENCOUNTER — Encounter (HOSPITAL_COMMUNITY): Payer: Self-pay | Admitting: Gastroenterology

## 2021-09-15 ENCOUNTER — Encounter (HOSPITAL_COMMUNITY): Payer: Self-pay | Admitting: *Deleted

## 2021-09-22 NOTE — Procedures (Signed)
Extubation Procedure Note  Patient Details:   Name: Marqui Dehaven DOB: 06-14-1971 MRN: [id number]   Airway Documentation:    Vent end date: 10/02/2021 Vent end time: 1316   Evaluation  O2 sats: currently acceptable Complications: No apparent complications Patient did tolerate procedure well. Bilateral Breath Sounds: Clear, Diminished   No  Jamal Haskin 10-02-21, 1:16 PM

## 2021-09-22 NOTE — Progress Notes (Signed)
Patient's family on floor including sister, Terrence Dupont, mother, niece. Family was updated on findings of EGD that showed necrotic tissue of stomach with no active bleeding. Patient MAPs remain low despite multiple pressor support. Discussed with family that patient is critically ill and will likely pass away within hours even with all interventions.  Terrence Dupont gave son's Cristie Hem) phone number [phone].   Cristie Hem is currently in [place], planning to fly down with his sister this evening.  I updated Alex on overnight events, low blood pressure despite multiple medications, and EGD showing necrotic gastric tissue.  Discussed with Cristie Hem that patient is critically ill and will likely pass away within hours even with all interventions.  At this time family would like to move to comfort measures and make DNR. Orders were placed.

## 2021-09-22 NOTE — Progress Notes (Signed)
eLink Physician-Brief Progress Note Patient Name: Jeffrey Sullivan DOB: 1971/05/29 MRN: [id number]   Date of Service  September 12, 2021  HPI/Events of Note  Patient with abdominal distension and oliguria.  eICU Interventions  KUB ordered,  Albumin 5 % 250 ml  iv x 1 ordered.        Thomasene Lot [name] September 12, 2021, 4:59 AM

## 2021-09-22 NOTE — Progress Notes (Signed)
PCCM Progress Note  Examined patient's EGD with GI at bedside. Family was updated post-EGD which demonstrated tissue ischemia but no focal bleed. His hypotension continued to worsen and he was maxed on three vasopressors and had SBPs in the 60s. We re-addressed goals of care and how at this point he was not responding to therapy. Aggressive measures at this point would not likely change the outcome as he is actively dying and would be too unstable for any additional testing or intervention. We discussed comfort care and what this would look like. After multiple phone calls with family, son via phone, and mother at bedside agreed to transition to comfort care. Patient was compassionately extubated and expired at 1355 at 09-23-21.  Cause of Death: Hemorrhagic shock secondary to upper GI bleed in setting of coagulopathy   Care Time: 3 min  Rodman Pickle, M.D. [facility] Pulmonary/Critical Care Medicine 2021-09-23 7:24 PM   See Amion for personal pager For hours between 7 PM to 7 AM, please call Elink for urgent questions

## 2021-09-22 NOTE — Progress Notes (Signed)
2 units of PRBC,  2 units of FFP, and 2 units of Platelets ordered. Spoke to blood bank and place second order for 2 units of PRBC, 2 units of FFP and 2 units of Platelets which will be placed on hold.

## 2021-09-22 NOTE — Procedures (Signed)
Arterial Catheter Insertion Procedure Note  Jeffrey Sullivan  [id number]  [birth year]  Date:08/27/2021  Time:10:27 AM    Provider Performing: Florentina Addison Rease Swinson    Procedure: Insertion of Arterial Line (99357) with US guidance (01779)   Indication(s) Blood pressure monitoring and/or need for frequent ABGs  Consent Risks of the procedure as well as the alternatives and risks of each were explained to the patient and/or caregiver.  Consent for the procedure was obtained and is signed in the bedside chart  Anesthesia None   Time Out Verified patient identification, verified procedure, site/side was marked, verified correct patient position, special equipment/implants available, medications/allergies/relevant history reviewed, required imaging and test results available.   Sterile Technique Maximal sterile technique including full sterile barrier drape, hand hygiene, sterile gown, sterile gloves, mask, hair covering, sterile ultrasound probe cover (if used).   Procedure Description Area of catheter insertion was cleaned with chlorhexidine and draped in sterile fashion. With real-time ultrasound guidance an arterial catheter was placed into the left radial artery.  Appropriate arterial tracings confirmed on monitor.     Complications/Tolerance None; patient tolerated the procedure well.   EBL Minimal   Specimen(s) None

## 2021-09-22 NOTE — Accreditation Note (Signed)
Restraints reported to CMS  Pursuant to regulation 482.13 (G) (3) use of restraints was logged and CMS was notified via electronic portal on 09/08/2021.

## 2021-09-22 NOTE — Progress Notes (Signed)
NAME:  Jeffrey Sullivan, MRN:  [id number], DOB:  [birth year], LOS: 4 ADMISSION DATE:  09/19/2021, CONSULTATION DATE:  08/31/2021 REFERRING MD:  Ernestine Mcmurray - EM , CHIEF COMPLAINT:  Acute metabolic encephalopathy    History of Present Illness:  51 yo M PMH cirrhosis, polysubstance abuse including EtOH abuse and meth abuse presented to [facility] ED via EMS with AMS after being found down, admitted due to acute encephalopathy and intubated for airway protection 2/7.  Pertinent  Medical History  Substance use disorder Cirrhosis  Significant Hospital Events: Including procedures, antibiotic start and stop dates in addition to other pertinent events   2/7 found down OOH, BIB EMS as suspected OD. Intubated in ED. Admitted to ICU 2/8 Extubated then developed episodes of apnea and became unresponsive. Re-intubated. 2/9 apnea with SBT, self-extubated  Interim History / Subjective:  Patient self extubated around 1645, was reintubated. He was hypothermic overnight and bile colored emesis suctioned out of airway.  He also had abdomen distension and oliguria. 2L stool output in last 24 hours.  Objective   Blood pressure 100/77, pulse 73, temperature 99.1 F (37.3 C), resp. rate (!) 21, height 5\' 8"  (1.727 m), weight 82 kg, SpO2 100 %.    Vent Mode: PRVC FiO2 (%):  [40 %] 40 % Set Rate:  [22 bmp] 22 bmp Vt Set:  [550 mL] 550 mL PEEP:  [5 cmH20-8 cmH20] 5 cmH20 Plateau Pressure:  [17 cmH20-22 cmH20] 20 cmH20   Intake/Output Summary (Last 24 hours) at 09/04/21 0522 Last data filed at September 04, 2021 0400 Gross per 24 hour  Intake 3942.72 ml  Output 2795 ml  Net 1147.72 ml    Filed Weights   09/15/2021 2033 09/01/21 0500 09/02/21 0123  Weight: 90.7 kg 82.1 kg 82 kg   Examination: General: Critically ill appearing middle aged M intubated, sedated  HENT: NCAT ETT secure, pupils equal to light bilaterally  Lungs: Coarse bilaterally. Mechanically ventilated  Cardiovascular: RRR, S1s2 no rgm  Abdomen: Distended,  orange bile out of OG tube Extremities: no acute joint deformity. No cyanosis or clubbing  Neuro: Sedated, eyes closed, not following commands GU: foley, blood-tinged urine Skin: scattered ecchymosis on legs.   Na 148 K 3.4 CO2 18, Cl 119 Creatinine 1.85, BUN 22, Tbili 4.4-5.3 Hgb 7.7-8.1 Platelets 46-69 Oliguria starting @ 2200 O 4750 N -417  Resolved Hospital Problem list   AGMA-resolved Lactic acidosis  Assessment & Plan:  Upper GIB Acute on chronic blood loss anemia Hematamesis per OG tube. GI called. Blood given. Patient required central line placement for pressure support. -Continue IV Protonix BID -restart octreotide -Monitor H&H -Transfuse if less than 7  Small bowel obstruction OG tube with suction  Acute hepatic encephalopathy EtOH abuse with acute alcohol intoxication  Polysubstance abuse 2L stool output in last 24 hours.  Continue CIWA scale Continue thiamine and IV fluid Watch for signs of withdrawal Hold lactulose and colace  Acute hypoxic respiratory failure requiring intubation Small right sided pleural effusion  Aspiration pneumonia Continue lung protective ventilation -PAD protocol with RASS goal 0/-1 -VAP, pulm hygiene  -Continue IV Unasyn, day 4  Acute alcoholic hepatitis Decompensated Cirrhosis Patient has long history of alcohol abuse. US showed cirrhotic changes. MELD 15, Child pugh class C. Ceruloplasmin low, 24 hr urine copper study pending to evaluate for Wilson disease. -Albumin -GI following appreciate recommendations, no EGD planned for now -Unasyn day 4 -f/u urine copper study  AKI NAGMA Hypernatremia Creatinine 1.3-1.85, GFR 44. This is likely 2/2 to high output  of stool. -Avoid nephrotoxic medications -Renally dose medications -trend creatinine -albumin  Hematuria UA on presentation with hemoglobin, he has gross hematuria while in unit. Clots present in foley catheter concerning for nonglomerular etiology.    Diabetes Hgb A1c of 7.0, new diagnosis. BG elevated into 140s to 180s.  -SSI -BG q 4 hrs  Hypocalcemia/Hypomagnesemia/Hyponatremia/Hypokalemia Continue aggressive electrolyte supplement.  Thrombocytopenia Likely related to bone marrow suppression caused by alcohol He was given 2 units FFP 2/7.   Best Practice (right click and "Reselect all SmartList Selections" daily)   Diet/type: NPO DVT prophylaxis: SCD GI prophylaxis: PPI Lines: N/A Foley:  Yes, and it is still needed Code Status:  full code Last date of multidisciplinary goals of care discussion [2/10: Patient sister was updated, decision was to continue full scope of care]  Adalynne Steffensmeier M. Moosa Bueche, D.O.  Internal Medicine Resident, PGY-1 [facility]  Pager: [phone] 5:22 AM, 09/19/2021   **Please contact the on call pager after 5 pm and on weekends at [phone].**

## 2021-09-22 NOTE — Procedures (Signed)
Intubation Procedure Note  Jeffrey Sullivan  [id number]  [birth year]  Date:09/09/2021  Time:11:18 AM   Provider Performing:Rohn Fritsch Mechele Collin, MD   Procedure: Intubation (31500)  Indication(s) Cuff leak, inadequate tidal volumes  Consent Unable to obtain consent due to emergent nature of procedure.   Anesthesia Fentanyl   Time Out Verified patient identification, verified procedure, site/side was marked, verified correct patient position, special equipment/implants available, medications/allergies/relevant history reviewed, required imaging and test results available.   Sterile Technique Usual hand hygeine, masks, and gloves were used   Procedure Description Patient positioned in bed supine.  Sedation given as noted above.  Patient was intubated with endotracheal tube using Glidescope.  View was Grade 3 only epiglottis  with ETT in place. ETT was exchanged however continued to have cuff leak. ETT was withdrawn and patient was re-intubated. Number of attempts was  1 .  Colorimetric CO2 detector was consistent with tracheal placement.   Complications/Tolerance None; patient tolerated the procedure well. Chest X-ray is ordered to verify placement.   EBL Minimal   Specimen(s) None

## 2021-09-22 NOTE — Procedures (Signed)
Central Venous Catheter Insertion Procedure Note  Anibal Quinby  [id number]  02-28-1971  Date:2021/09/23  Time:10:25 AM   Provider Performing:Dorien Mayotte   Procedure: Insertion of Non-tunneled Central Venous [phone]) with US guidance (62836)   Indication(s) Medication administration  Consent Risks of the procedure as well as the alternatives and risks of each were explained to the patient and/or caregiver.  Consent for the procedure was obtained and is signed in the bedside chart  Anesthesia Topical only with 1% lidocaine   Timeout Verified patient identification, verified procedure, site/side was marked, verified correct patient position, special equipment/implants available, medications/allergies/relevant history reviewed, required imaging and test results available.  Sterile Technique Maximal sterile technique including full sterile barrier drape, hand hygiene, sterile gown, sterile gloves, mask, hair covering, sterile ultrasound probe cover (if used).  Procedure Description Area of catheter insertion was cleaned with chlorhexidine and draped in sterile fashion.  With real-time ultrasound guidance a central venous catheter was placed into the left internal jugular vein. Nonpulsatile blood flow and easy flushing noted in all ports.  The catheter was sutured in place and sterile dressing applied.  Complications/Tolerance None; patient tolerated the procedure well. Chest X-ray is ordered to verify placement for internal jugular or subclavian cannulation.   Chest x-ray is not ordered for femoral cannulation.  EBL Minimal  Specimen(s) None

## 2021-09-22 NOTE — Progress Notes (Signed)
PT Cancellation Note  Patient Details Name: Jeffrey Sullivan MRN: [id number] DOB: Jan 06, 1971   Cancelled Treatment:    Reason Eval/Treat Not Completed: Patient not medically ready. Pt remains intubated and sedated. Will continue to monitor for readiness.    [name] [facility] 09/20/2021, 8:24 AM Skip Mayer PT Acute Rehabilitation Services Pager [phone] Office [phone]

## 2021-09-22 NOTE — Progress Notes (Addendum)
° °       Daily Rounding Note ° °09/08/2021, 8:20 AM ° LOS: 4 days  ° °SUBJECTIVE:   °Chief complaint:  decompensated  cirrhosis.  Transfusion requ anemia.   °   °Bilious oral secretions then bloody NGT secretions noted a few h ago.  Oliguria: 775 mL output yesterday.   Additional pressors added this AM for hypotension, currently 80s/40s.  Tachy to low 100s.   °Hypothermia to 94.5 F yest evening, Bear Hugger added.    °Stool output 2.8 L 2 d ago, 1.5 L yesterday.   ° °OBJECTIVE:        ° Vital signs in last 24 hours:    °Temp:  [94.5 °F (34.7 °C)-99.1 °F (37.3 °C)] 99 °F (37.2 °C) (02/10 0533) °Pulse Rate:  [61-85] 78 (02/10 0533) °Resp:  [18-23] 22 (02/10 0533) °BP: (89-123)/(57-83) 92/57 (02/10 0400) °SpO2:  [91 %-100 %] 91 % (02/10 0533) °FiO2 (%):  [40 %-60 %] 60 % (02/10 0717) °Last BM Date: 09/02/21 °Filed Weights  ° 08/27/2021 2033 09/01/21 0500 09/02/21 0123  °Weight: 90.7 kg 82.1 kg 82 kg  ° °General: intubated, looks ill.     °Heart: tachy to low 100s °Chest: intubated on vent, no labored breathing °Abdomen: moderately tense.  BS hypoactive but no tinkling or high-pitched BS.  ~ 700 mL bloody liquid in canister.  °Extremities: + pedal edema.   °Neuro/Psych:  unresponsive, sedated.   ° °Intake/Output from previous day: °02/09 0701 - 02/10 0700 °In: 3345.8 [I.V.:1517; NG/GT:730; IV Piggyback:1098.8] °Out: 2175 [Urine:775; Stool:1400] ° °Intake/Output this shift: °No intake/output data recorded. ° °Lab Results: °Recent Labs  °  09/01/21 °0626 09/01/21 °1519 09/01/21 °1645 09/02/21 °0426 09/21/2021 °0441  °WBC 6.3  --   --  4.6 4.4  °HGB 7.9*   < > 8.5* 7.7* 8.1*  °HCT 23.1*   < > 25.0* 22.9* 25.8*  °PLT 45*  --   --  46* 69*  ° < > = values in this interval not displayed.  ° °BMET °Recent Labs  °  09/01/21 °1836 09/02/21 °0426 09/19/2021 °0441  °NA 141 144 148*  °K 4.0 3.4* 3.4*  °CL 111 116* 119*  °CO2 19* 19* 18*  °GLUCOSE 192* 245* 151*  °BUN 14 17 22*   °CREATININE 1.16 1.30* 1.85*  °CALCIUM 8.2* 8.3* 8.3*  ° °LFT °Recent Labs  °  09/01/21 °0626 09/02/21 °0426 09/19/2021 °0441  °PROT 5.8* 6.1* 6.3*  °ALBUMIN 2.2* 2.9* 2.8*  °AST 61* 54* 53*  °ALT 23 20 23  °ALKPHOS 111 90 113  °BILITOT 5.0* 4.4* 5.3*  ° °PT/INR °No results for input(s): LABPROT, INR in the last 72 hours. °Hepatitis Panel °No results for input(s): HEPBSAG, HCVAB, HEPAIGM, HEPBIGM in the last 72 hours. ° °Studies/Results: °Portable Chest x-ray ° °Result Date: 09/02/2021 °CLINICAL DATA:  Endotracheal tube placement EXAM: PORTABLE CHEST 1 VIEW COMPARISON:  09/01/2021 FINDINGS: Endotracheal tube placed with tip measuring 4.3 cm above the carina. An enteric tube has been placed. Tip is off the field of view but below the left hemidiaphragm. Heart size is normal. Shallow inspiration. Infiltration or atelectasis in both lung bases appears to be increasing since earlier examination, possibly due to decreased inspiratory effort. Probable small right pleural effusion. No pneumothorax. Gaseous distention of the stomach. IMPRESSION: Appliances appear in satisfactory position. Shallow inspiration with infiltration or atelectasis in both lung bases. Probable small right pleural effusion. Electronically Signed   By: William  Stevens M.D.   On: 09/02/2021 17:34  ° °  possibly due to decreased inspiratory effort. Probable small right pleural effusion. No pneumothorax. Gaseous distention of the stomach. IMPRESSION: Appliances appear in satisfactory position. Shallow inspiration with infiltration or atelectasis in both lung bases. Probable small right pleural effusion. Electronically Signed   By: Lucienne Capers M.D.   On: 09/02/2021 17:34   Portable Chest x-ray  Result Date: 09/01/2021 CLINICAL DATA:  Intubated EXAM: PORTABLE CHEST 1 VIEW COMPARISON:  09/11/2021 FINDINGS: Endotracheal tube tip is about 2.8 cm superior to the carina. Esophageal tube tip below the diaphragm but incompletely visualized. Interval opacity at the left lung base. Suspect fluid level in the right lower chest. No visible pneumothorax. Stable cardiomediastinal silhouette. Increased vascular congestion and development of hazy perihilar opacity likely edema. IMPRESSION: 1. Endotracheal tube tip about 2.8 cm superior to carina 2. Increased bilateral effusions airspace disease at the lung bases. Increased vascular  congestion with development of hazy perihilar opacity likely due to edema. Electronically Signed   By: Donavan Foil M.D.   On: 09/01/2021 16:58   DG Abd Portable 1V  Result Date: 09-06-2021 CLINICAL DATA:  51 year old male with history of ileus. EXAM: PORTABLE ABDOMEN - 1 VIEW COMPARISON:  Abdominal radiograph 09/01/2021. FINDINGS: Tip of feeding tube is in the antral pre-pyloric region of the stomach. Diffuse gaseous distension throughout the small bowel and colon. No pneumoperitoneum. IMPRESSION: 1. Diffuse gaseous distension throughout the small bowel and colon, which could be indicative of ileus or distal colonic obstruction. 2. Tip of feeding tube is in the antral pre-pyloric region of the stomach. Electronically Signed   By: Vinnie Langton M.D.   On: 2021/09/06 06:53   DG Abd Portable 1V  Result Date: 09/01/2021 CLINICAL DATA:  Feeding tube EXAM: PORTABLE ABDOMEN - 1 VIEW COMPARISON:  None. FINDINGS: Enteric tube tip overlies the distal stomach. Upper gas pattern is unremarkable IMPRESSION: Enteric tube tip overlies the distal stomach Electronically Signed   By: Donavan Foil M.D.   On: 09/01/2021 16:48    Scheduled Meds:  sodium chloride   Intravenous Once   sodium chloride   Intravenous Once   sodium chloride   Intravenous Once   chlorhexidine gluconate (MEDLINE KIT)  15 mL Mouth Rinse BID   Chlorhexidine Gluconate Cloth  6 each Topical UD   EPINEPHrine NaCl       gabapentin  100 mg Per Tube Q8H   insulin aspart  0-15 Units Subcutaneous Q4H   mouth rinse  15 mL Mouth Rinse 10 times per day   multivitamin with minerals  1 tablet Per Tube Daily   mupirocin ointment  1 application Nasal BID   pantoprazole (PROTONIX) IV  40 mg Intravenous Q12H   sodium bicarbonate       sodium bicarbonate       sodium bicarbonate       thiamine injection  100 mg Intravenous Daily   Continuous Infusions:  sodium chloride 10 mL/hr at 09-06-21 0700   ampicillin-sulbactam (UNASYN) IV 3 g (06-Sep-2021  0717)   epinephrine     fentaNYL infusion INTRAVENOUS 200 mcg/hr (09/06/21 0700)   norepinephrine (LEVOPHED) Adult infusion 60 mcg/min (09-06-2021 1014)   octreotide  (SANDOSTATIN)    IV infusion 50 mcg/hr (2021-09-06 1012)   potassium chloride     propofol (DIPRIVAN) infusion 15 mcg/kg/min (Sep 06, 2021 1023)   vasopressin 0.04 Units/min (09/06/2021 1000)   PRN Meds:.diphenhydrAMINE, docusate, fentaNYL, ondansetron (ZOFRAN) IV, polyethylene glycol    ASSESMENT:     Decompensated ETOH cirrhosis.  MELD 17 at arrival,  24 today.   Low ceruloplasmin, so 24 h urine copper ordered.  Reports of polysubstance abuse.      Bleeding per NGT.  Was for EGD this AM for variceal surveillance (before onset of blood per NGT) so this cxld.  The bleeding, worsening BPs subsequently started so BS EGD now planned.      [place] anemia.  Unknown baseline.  S/p 2 PRBCs, 3rd unit transfusing now.  Hgb 10.2 >> 6.6 >> 8.1 0440 this AM >> 7.1 0940 this AM.  No FOBT tests so far, stool is brown.   Octreotide gtt 2/7 thru 2/9.      AMS.  Spells apnea. Acidosis.  Required re-intubation.  Ammonia 103 >>  89.   Lactulose, miralax in place.  No Rifaximin.  Sedation w Fenatnyl, propofol in place.  FT placed ( tip in antrum per xray) to allow for admin meds.      Coagulopathy.  INR 2.5 >> 1.6.     AKI, ? HRS.  Rising BUN/creat.  GFR > 60 ... 44.  Albumin administered starting 2/7.  One dose only ordered for today.  Levophed in place.  CCM concerned that copious med induced diarrhea may be cause.      Ileus SB and colon per 2/10 KUB     Ascites.      ? Aspiration pna, unasyn in place.       Hypokalemia.       Hypernatremia.  Na now 156.     PLAN     EGD for today at bedside.      ? Renal consult?   ? Restart scheduled albumin, octreotide, midodrine?      Jeffrey Sullivan  September 10, 2021, 8:20 AM Phone [phone]    I have taken a history, reviewed the chart and examined the patient. I performed a substantive portion of this  encounter, including complete performance of at least one of the key components, in conjunction with the APP. I agree with the APP's note, impression and recommendations  Overnight the patient developed vomiting and was found to have an ileus.  His CoreTrack was replaced with an OG tube for decompression and yielded a large volume of orange colored fluid.  He had also had an acute bump in his creatinine.  His hgb was stable this morning, and the decision was made to hold off on his EGD for variceal screening due to these developments.   Shortly thereafter, however, he developed abrupt hypotension with production of large amount of bright red blood via the OG tube.  The Critical Care team began resuscitation with central line placement, pressor support and blood products, thus far with 2 units pRBCs, 2 units FFP and 1 unit platelet.  He remains hypotensive now on 2 pressors.  On octreotide and PPI  He is having a massive GI bleed likely from esophageal varices and his prognosis is very tenuous.  I spoke with the patient's mother and sister about his condition and the plans for urgent endoscopic hemostasis.  His mother indicated understanding and consent for the procedure.    Montrell Cessna E. Candis Schatz, MD [facility]

## 2021-09-22 NOTE — Interval H&P Note (Signed)
History and Physical Interval Note:  09/13/2021 11:29 AM  Jeffrey Sullivan  has presented today for surgery, with the diagnosis of Upper GI bleed.  The various methods of treatment have been discussed with the patient and family. After consideration of risks, benefits and other options for treatment, the patient has consented to  Procedure(s): ESOPHAGOGASTRODUODENOSCOPY (EGD) (N/A) as a surgical intervention.  The patient's history has been reviewed, patient examined, no change in status, stable for surgery.  I have reviewed the patient's chart and labs.  Questions were answered to the patient's satisfaction.    Please see progress note today  Jenel Lucks

## 2021-09-22 NOTE — Op Note (Signed)
[facility] Patient Name: Jeffrey Sullivan Procedure Date : 09-08-21 MRN: [id number] Attending MD: Dub Amis. Jeffrey Sullivan , MD Date of Birth: October 29, 1970 CSN: [id number] Age: 51 Admit Type: Inpatient Procedure:                Upper GI endoscopy Indications:              Hematemesis: Patient developed abrupt onset                            hypotension Providers:                Lorin Picket E. Jeffrey Rand, MD, Alan Ripper, Technician,                            Fransisca Connors Referring MD:              Medicines:                Monitored Anesthesia Care/Intubated and sedated per                            PCCM service Complications:            No immediate complications. Estimated Blood Loss:     Estimated blood loss: none. Procedure:                Pre-Anesthesia Assessment:                           - Prior to the procedure, a History and Physical                            was performed, and patient medications and                            allergies were reviewed. The patient's tolerance of                            previous anesthesia was also reviewed. The risks                            and benefits of the procedure and the sedation                            options and risks were discussed with the patient.                            All questions were answered, and informed consent                            was obtained. Prior Anticoagulants: The patient has                            taken no previous anticoagulant or antiplatelet                            agents. ASA Grade Assessment: V -  A moribund                            patient who is not expected to survive without the                            operation. After reviewing the risks and benefits,                            the patient was deemed in satisfactory condition to                            undergo the procedure.                           After obtaining informed consent, the endoscope was                             passed under direct vision. Throughout the                            procedure, the patient's blood pressure, pulse, and                            oxygen saturations were monitored continuously. The                            [id number]-[id number] ([id number] Olympus endoscope was introduced                            through the mouth, and advanced to the body of the                            stomach. The [id number]-[id number] ([id number] Olympus endoscope                            was introduced through the mouth, and advanced to                            the second part of duodenum. The upper GI endoscopy                            was accomplished without difficulty. The patient                            tolerated the procedure well. Scope In: Scope Out: Findings:      The examined esophagus was normal. There were no esophageal varices and       no esophagitis      Diffuse severe mucosal changes characterized by dusky, purplish were       found in the gastric fundus, in the gastric body and in the gastric       antrum. At first, it was felt that the purplish mucosa was adherent       clot. However, after extensive lavage,  mucosal pit patterns were seen       within the regions of discoloration, which confirmed the discoloration       to be mucosal, not secondary to adherent clot. Also there were areas of       friable, erythematous mucosa adjacent to the dusky mucosa. No focal       ulcer or bleeding source was found. There was no red blood present in       the stomach, except for scant contact oozing from the endoscope and the       water lavage. Although no active bleeding was encountered, to reduce       risk of further bleeding, hemostatic spray was deployed. Multiple sprays       were applied throughout the fundus, body and antrum. There was no       bleeding at the end of the procedure.      A few localized erosions without bleeding were found in the second       portion of the  duodenum, secondary to irritation from the feeding tube.       The duodenum was otherwise unremarkable. Impression:               - Normal esophagus.                           - Severely abnormal mucosal abnormalities in the                            mucosa involving in the gastric fundus, gastric                            body and antrum characterized by purplish, dusky                            appearance with friability. This was felt to most                            likely represent tissue ischemia, which would                            coincide with the patient's sudden, refractory                            hypotension. The PCCM team was asked to observe the                            endoscopic findings during the procedure. There                            were not lesions that were amenable to endoscopic                            therapy. Hemostatic spray applied in hopes that                            this may prevent further bleeding                           -  Duodenal erosions without bleeding.                           - No specimens collected. Recommendation:           - Given refractory hypotension, rapid clinical                            deterioration and endoscopic findings suggestive of                            gastric ischemia, agree with PCCM team that comfort                            care measures be considered.                           - Etiology of ischemic insult unclear at this point. Procedure Code(s):        --- Professional ---                           [phone], Esophagogastroduodenoscopy, flexible,                            transoral; with control of bleeding, any method Diagnosis Code(s):        --- Professional ---                           K26.9, Duodenal ulcer, unspecified as acute or                            chronic, without hemorrhage or perforation                           K92.0, Hematemesis CPT copyright 2019 [employer] Medical  Association. All rights reserved. The codes documented in this report are preliminary and upon coder review may  be revised to meet current compliance requirements. Kenneith Stief E. Jeffrey Rand, MD 09/20/2021 12:59:50 PM This report has been signed electronically. Number of Addenda: 0

## 2021-09-22 NOTE — Progress Notes (Addendum)
Brief Nutrition Support Note  Pt's TF were discontinued overnight. Reviewed chart and noted emesis documented by RN. KUB was obtained which was suggestive of an ileus.  Discussed restarting feeds and holding at a trickle today with MD. However, pt with active GI bleed at this time. TF to be held today with reassessment for appropriateness tomorrow.   MD was advised to place a new RD consult if feeds are desired over the weekend.  Reviewed refeeding labs, K low this AM but replacements are ordered. SBG, Mg, and phosphorus seem to have stabilized. Will continue to monitor pt and reassess ability to restart enteral feeds.  Greig Castilla, RD, LDN Clinical Dietitian RD pager # available in [facility]  After hours/weekend pager # available in [facility]

## 2021-09-22 NOTE — Progress Notes (Signed)
eLink Physician-Brief Progress Note Patient Name: Jeffrey Sullivan DOB: 03-31-1971 MRN: [id number]   Date of Service  09/07/2021  HPI/Events of Note  KUB reviewed.  eICU Interventions  Feeding tube in good position, dilated loops of bowel consistent  with ileus.        Thomasene Lot Malak Orantes 09/19/2021, 6:50 AM

## 2021-09-22 NOTE — Progress Notes (Signed)
[facility] ADULT ICU REPLACEMENT PROTOCOL   The patient does apply for the [facility] Adult ICU Electrolyte Replacment Protocol based on the criteria listed below:   1.Exclusion criteria: TCTS patients, ECMO patients, and Dialysis patients 2. Is GFR >/= 30 ml/min? Yes.    Patient's GFR today is 44 3. Is SCr </= 2? Yes.   Patient's SCr is 1.85 mg/dL 4. Did SCr increase >/= 0.5 in 24 hours? No. 5.Pt's weight >40kg  Yes.   6. Abnormal electrolyte(s): K+ 3.4  7. Electrolytes replaced per protocol 8.  Call MD STAT for K+ </= 2.5, Phos </= 1, or Mag </= 1 Physician:  n/a  Melvern Banker 2021/09/17 6:00 AM

## 2021-09-22 NOTE — H&P (View-Only) (Signed)
Daily Rounding Note  Sep 10, 2021, 8:20 AM  LOS: 4 days   SUBJECTIVE:   Chief complaint:  decompensated  cirrhosis.  Transfusion requ anemia.      Bilious oral secretions then bloody NGT secretions noted a few h ago.  Oliguria: 775 mL output yesterday.   Additional pressors added this AM for hypotension, currently 80s/40s.  Tachy to low 100s.   Hypothermia to 94.5 F yest evening, Bear Hugger added.    Stool output 2.8 L 2 d ago, 1.5 L yesterday.    OBJECTIVE:         Vital signs in last 24 hours:    Temp:  [94.5 F (34.7 C)-99.1 F (37.3 C)] 99 F (37.2 C) (02/10 0533) Pulse Rate:  [61-85] 78 (02/10 0533) Resp:  [18-23] 22 (02/10 0533) BP: (89-123)/(57-83) 92/57 (02/10 0400) SpO2:  [91 %-100 %] 91 % (02/10 0533) FiO2 (%):  [40 %-60 %] 60 % (02/10 0717) Last BM Date: 09/02/21 Filed Weights   09/09/2021 2033 09/01/21 0500 09/02/21 0123  Weight: 90.7 kg 82.1 kg 82 kg   General: intubated, looks ill.     Heart: tachy to low 100s Chest: intubated on vent, no labored breathing Abdomen: moderately tense.  BS hypoactive but no tinkling or high-pitched BS.  ~ 700 mL bloody liquid in canister.  Extremities: + pedal edema.   Neuro/Psych:  unresponsive, sedated.    Intake/Output from previous day: 02/09 0701 - 02/10 0700 In: 3345.8 [I.V.:1517; NG/GT:730; IV Piggyback:1098.8] Out: 2175 [Urine:775; Stool:1400]  Intake/Output this shift: No intake/output data recorded.  Lab Results: Recent Labs    09/01/21 0626 09/01/21 1519 09/01/21 1645 09/02/21 0426 2021-09-10 0441  WBC 6.3  --   --  4.6 4.4  HGB 7.9*   < > 8.5* 7.7* 8.1*  HCT 23.1*   < > 25.0* 22.9* 25.8*  PLT 45*  --   --  46* 69*   < > = values in this interval not displayed.   BMET Recent Labs    09/01/21 1836 09/02/21 0426 2021-09-10 0441  NA 141 144 148*  K 4.0 3.4* 3.4*  CL 111 116* 119*  CO2 19* 19* 18*  GLUCOSE 192* 245* 151*  BUN 14 17 22*   CREATININE 1.16 1.30* 1.85*  CALCIUM 8.2* 8.3* 8.3*   LFT Recent Labs    09/01/21 0626 09/02/21 0426 09-10-21 0441  PROT 5.8* 6.1* 6.3*  ALBUMIN 2.2* 2.9* 2.8*  AST 61* 54* 53*  ALT _0 ALKPHOS 111 90 113  BILITOT 5.0* 4.4* 5.3*   PT/INR No results for input(s): LABPROT, INR in the last 72 hours. Hepatitis Panel No results for input(s): HEPBSAG, HCVAB, HEPAIGM, HEPBIGM in the last 72 hours.  Studies/Results: Portable Chest x-ray  Result Date: 09/02/2021 CLINICAL DATA:  Endotracheal tube placement EXAM: PORTABLE CHEST 1 VIEW COMPARISON:  09/01/2021 FINDINGS: Endotracheal tube placed with tip measuring 4.3 cm above the carina. An enteric tube has been placed. Tip is off the field of view but below the left hemidiaphragm. Heart size is normal. Shallow inspiration. Infiltration or atelectasis in both lung bases appears to be increasing since earlier examination,  possibly due to decreased inspiratory effort. Probable small right pleural effusion. No pneumothorax. Gaseous distention of the stomach. IMPRESSION: Appliances appear in satisfactory position. Shallow inspiration with infiltration or atelectasis in both lung bases. Probable small right pleural effusion. Electronically Signed   By: Lucienne Capers M.D.   On: 09/02/2021 17:34   Portable Chest x-ray  Result Date: 09/01/2021 CLINICAL DATA:  Intubated EXAM: PORTABLE CHEST 1 VIEW COMPARISON:  09/06/2021 FINDINGS: Endotracheal tube tip is about 2.8 cm superior to the carina. Esophageal tube tip below the diaphragm but incompletely visualized. Interval opacity at the left lung base. Suspect fluid level in the right lower chest. No visible pneumothorax. Stable cardiomediastinal silhouette. Increased vascular congestion and development of hazy perihilar opacity likely edema. IMPRESSION: 1. Endotracheal tube tip about 2.8 cm superior to carina 2. Increased bilateral effusions airspace disease at the lung bases. Increased vascular  congestion with development of hazy perihilar opacity likely due to edema. Electronically Signed   By: Donavan Foil M.D.   On: 09/01/2021 16:58   DG Abd Portable 1V  Result Date: 09/16/2021 CLINICAL DATA:  51 year old male with history of ileus. EXAM: PORTABLE ABDOMEN - 1 VIEW COMPARISON:  Abdominal radiograph 09/01/2021. FINDINGS: Tip of feeding tube is in the antral pre-pyloric region of the stomach. Diffuse gaseous distension throughout the small bowel and colon. No pneumoperitoneum. IMPRESSION: 1. Diffuse gaseous distension throughout the small bowel and colon, which could be indicative of ileus or distal colonic obstruction. 2. Tip of feeding tube is in the antral pre-pyloric region of the stomach. Electronically Signed   By: Vinnie Langton M.D.   On: September 16, 2021 06:53   DG Abd Portable 1V  Result Date: 09/01/2021 CLINICAL DATA:  Feeding tube EXAM: PORTABLE ABDOMEN - 1 VIEW COMPARISON:  None. FINDINGS: Enteric tube tip overlies the distal stomach. Upper gas pattern is unremarkable IMPRESSION: Enteric tube tip overlies the distal stomach Electronically Signed   By: Donavan Foil M.D.   On: 09/01/2021 16:48    Scheduled Meds:  sodium chloride   Intravenous Once   sodium chloride   Intravenous Once   sodium chloride   Intravenous Once   chlorhexidine gluconate (MEDLINE KIT)  15 mL Mouth Rinse BID   Chlorhexidine Gluconate Cloth  6 each Topical UD   EPINEPHrine NaCl       gabapentin  100 mg Per Tube Q8H   insulin aspart  0-15 Units Subcutaneous Q4H   mouth rinse  15 mL Mouth Rinse 10 times per day   multivitamin with minerals  1 tablet Per Tube Daily   mupirocin ointment  1 application Nasal BID   pantoprazole (PROTONIX) IV  40 mg Intravenous Q12H   sodium bicarbonate       sodium bicarbonate       sodium bicarbonate       thiamine injection  100 mg Intravenous Daily   Continuous Infusions:  sodium chloride 10 mL/hr at Sep 16, 2021 0700   ampicillin-sulbactam (UNASYN) IV 3 g (09/16/2021  0717)   epinephrine     fentaNYL infusion INTRAVENOUS 200 mcg/hr (September 16, 2021 0700)   norepinephrine (LEVOPHED) Adult infusion 60 mcg/min (09-16-21 1014)   octreotide  (SANDOSTATIN)    IV infusion 50 mcg/hr (09/16/2021 1012)   potassium chloride     propofol (DIPRIVAN) infusion 15 mcg/kg/min (2021/09/16 1023)   vasopressin 0.04 Units/min (2021/09/16 1000)   PRN Meds:.diphenhydrAMINE, docusate, fentaNYL, ondansetron (ZOFRAN) IV, polyethylene glycol    ASSESMENT:     Decompensated ETOH cirrhosis.  MELD 17 at arrival,  24 today.   Low ceruloplasmin, so 24 h urine copper ordered.  Reports of polysubstance abuse.      Bleeding per NGT.  Was for EGD this AM for variceal surveillance (before onset of blood per NGT) so this cxld.  The bleeding, worsening BPs subsequently started so BS EGD now planned.      [place] anemia.  Unknown baseline.  S/p 2 PRBCs, 3rd unit transfusing now.  Hgb 10.2 >> 6.6 >> 8.1 0440 this AM >> 7.1 0940 this AM.  No FOBT tests so far, stool is brown.   Octreotide gtt 2/7 thru 2/9.      AMS.  Spells apnea. Acidosis.  Required re-intubation.  Ammonia 103 >>  89.   Lactulose, miralax in place.  No Rifaximin.  Sedation w Fenatnyl, propofol in place.  FT placed ( tip in antrum per xray) to allow for admin meds.      Coagulopathy.  INR 2.5 >> 1.6.     AKI, ? HRS.  Rising BUN/creat.  GFR > 60 ... 44.  Albumin administered starting 2/7.  One dose only ordered for today.  Levophed in place.  CCM concerned that copious med induced diarrhea may be cause.      Ileus SB and colon per 2/10 KUB     Ascites.      ? Aspiration pna, unasyn in place.       Hypokalemia.       Hypernatremia.  Na now 156.     PLAN     EGD for today at bedside.      ? Renal consult?   ? Restart scheduled albumin, octreotide, midodrine?      Azucena Freed  September 10, 2021, 8:20 AM Phone [phone]    I have taken a history, reviewed the chart and examined the patient. I performed a substantive portion of this  encounter, including complete performance of at least one of the key components, in conjunction with the APP. I agree with the APP's note, impression and recommendations  Overnight the patient developed vomiting and was found to have an ileus.  His CoreTrack was replaced with an OG tube for decompression and yielded a large volume of orange colored fluid.  He had also had an acute bump in his creatinine.  His hgb was stable this morning, and the decision was made to hold off on his EGD for variceal screening due to these developments.   Shortly thereafter, however, he developed abrupt hypotension with production of large amount of bright red blood via the OG tube.  The Critical Care team began resuscitation with central line placement, pressor support and blood products, thus far with 2 units pRBCs, 2 units FFP and 1 unit platelet.  He remains hypotensive now on 2 pressors.  On octreotide and PPI  He is having a massive GI bleed likely from esophageal varices and his prognosis is very tenuous.  I spoke with the patient's mother and sister about his condition and the plans for urgent endoscopic hemostasis.  His mother indicated understanding and consent for the procedure.    Gustava Berland E. Candis Schatz, MD [facility]

## 2021-09-22 NOTE — Progress Notes (Signed)
OT Cancellation Note  Patient Details Name: Jeffrey Sullivan MRN: [id number] DOB: 1971/02/21   Cancelled Treatment:    Reason Eval/Treat Not Completed: Patient not medically ready Remains intubated and sedated. Will continue to monitor for therapy readiness.   Lorre Munroe 2021/09/23, 7:20 AM

## 2021-09-22 NOTE — TOC Initial Note (Signed)
Transition of Care [facility]) - Initial/Assessment Note    Patient Details  Name: Jeffrey Sullivan MRN: [id number] Date of Birth: 1971/05/18  Transition of Care [facility]) CM/SW Contact:    Beckie Busing, RN Phone [phone]  09/17/2021, 12:06 PM  Clinical Narrative:                  Transition of Care [facility]) Screening Note   Patient Details  Name: Jeffrey Sullivan Date of Birth: Apr 14, 1971   Transition of Care [facility]) CM/SW Contact:    Beckie Busing, RN Phone Number: 09/12/2021, 12:06 PM    Transition of Care Department [facility]) acknowledges patient and no TOC needs have been identified at this time. We will continue to monitor patient advancement through interdisciplinary progression rounds.           Patient Goals and CMS Choice        Expected Discharge Plan and Services                                                Prior Living Arrangements/Services                       Activities of Daily Living      Permission Sought/Granted                  Emotional Assessment              Admission diagnosis:  Cirrhosis (HCC) [K74.60] Acute encephalopathy [G93.40] Altered mental status, unspecified altered mental status type [R41.82] Patient Active Problem List   Diagnosis Date Noted   Malnutrition of moderate degree 09/02/2021   Acute encephalopathy 08-Sep-2021   PCP:  Pcp, No Pharmacy:   [facility] [id number] - [place], [place] - [address] [address] Phone: [phone] Fax: [phone]     Social Determinants of Health (SDOH) Interventions    Readmission Risk Interventions No flowsheet data found.

## 2021-09-22 NOTE — Progress Notes (Signed)
Pt. w/ DNR and comfort care orders, expired 09-11-2021 @ 1355. Death pronounced by Francina Ames, RN and this RN. MD aware, family at bedside. Approximately 26ml of Morphine gtt wasted by Lezlie Lye, RN and this RN.

## 2021-09-22 NOTE — Progress Notes (Signed)
°   2021-09-18 1650  Clinical Encounter Type  Visited With Family  Visit Type Death (Spiritual Support and Prayer with Mother of Patient)  Referral From Nurse  Consult/Referral To Chaplain  Spiritual Encounters  Spiritual Needs Prayer;Grief support   Chaplain Jon Gills received page from nurse regarding death of patient and the need for support to his mother. I provided prayer and emotional support to the mother.

## 2021-09-22 NOTE — Progress Notes (Signed)
Pt continues to have bile colored secretions coming from mouth another @ 50cc suctioned

## 2021-09-22 NOTE — Progress Notes (Signed)
When mouth care complete at 410 pt began having yellow secretions running out out of his left nostril then began pouring out of his mouth, Yankauer suctioned @ 125cc of yellow bile colored emesis.  Elink notified PRN Zofran given

## 2021-09-22 NOTE — Death Summary Note (Addendum)
DEATH SUMMARY   Patient Details  Name: Jeffrey Sullivan MRN: [id number] DOB: September 22, 1970  Admission/Discharge Information   Admit Date:  26-Sep-2021  Date of Death: Date of Death: 2021/09/30  Time of Death: Time of Death: 8  Length of Stay: 4  Referring Physician: Pcp, No   Reason(s) for Hospitalization  Acute hepatic encephalopathy  Diagnoses  Preliminary cause of death:  Hemorrhagic shock   Secondary Diagnoses (including complications and co-morbidities):  Principal Problem:   Acute hepatic encephalopathy Active Problems:   Malnutrition of moderate degree   Acute hypoxemic respiratory failure (HCC)   Aspiration pneumonia (HCC)   Hemorrhagic shock (HCC)   Acute kidney injury ([place])   Hypokalemia   Hypernatremia   Ileus (HCC)   Decompensated hepatic cirrhosis (HCC)   Acute alcoholic hepatitis   Thrombocytopenia (HCC)   Hypocalcemia   Hypomagnesemia   Diabetes (HCC)   Hematuria   Elevated INR   Hypoalbuminemia   Upper GI bleed   Gastric necrosis   Alcohol dependence with acute intoxication, continuous ([place])   Acute hypercapnic respiratory failure [facility])   Brief Hospital Course (including significant findings, care, treatment, and services provided and events leading to death)  Danieljames Halfpenny is a 51 y.o. year old male with past medical history of EtOH and Meth use, cirrhosis who presented after being found down. He was intubated in ED 2/7 for airway protection, bloody OGT output on admission. No further episodes of hematemesis, Hgb stable. He was extubated 2/8, but required reintubation due to apneic episodes. Patient did not pass SBT, but self-extubated 2/9, then reintubated 2/9. Overnight, he had distended abdomen and new hypotension requiring levophed. KUB showed ileus vs small bowel obstruction. Hgb was stable and no hematemesis since admission, called and spoke with GI prior to placing OG tube.  OG output was orange initially, but then bloody .GI performed urgent EGD  due to concern for variceal bleed.  EGD showed no active bleeding and necrotic gastric tissue. Patient unlikely to survive surgical interventions.  Discussed current prognosis with family and they decided to transition to comfort care with DNR.   Pertinent Labs and Studies  Significant Diagnostic Studies CT Head Wo Contrast  Result Date: 09/26/2021 CLINICAL DATA:  Unresponsive.  Drug overdose. EXAM: CT HEAD WITHOUT CONTRAST TECHNIQUE: Contiguous axial images were obtained from the base of the skull through the vertex without intravenous contrast. RADIATION DOSE REDUCTION: This exam was performed according to the departmental dose-optimization program which includes automated exposure control, adjustment of the mA and/or kV according to patient size and/or use of iterative reconstruction technique. COMPARISON:  None. FINDINGS: Brain: No evidence of acute infarction, hemorrhage, hydrocephalus, extra-axial collection or mass lesion/mass effect. Mild diffuse cerebral atrophy. Vascular: Intracranial arterial vascular calcifications. Skull: Calvarium appears intact. Sinuses/Orbits: Retention cysts in the left maxillary antrum. Mucosal thickening in the paranasal sinuses. No acute air-fluid levels. Mastoid air cells are clear. Other: None. IMPRESSION: No acute intracranial abnormalities. Chronic atrophy. Inflammatory changes in the paranasal sinuses. Electronically Signed   By: Lucienne Capers M.D.   On: 2021-09-26 22:11   CT Cervical Spine Wo Contrast  Result Date: 09-26-21 CLINICAL DATA:  Unresponsive after drug overdose.  Neck trauma. EXAM: CT CERVICAL SPINE WITHOUT CONTRAST TECHNIQUE: Multidetector CT imaging of the cervical spine was performed without intravenous contrast. Multiplanar CT image reconstructions were also generated. RADIATION DOSE REDUCTION: This exam was performed according to the departmental dose-optimization program which includes automated exposure control, adjustment of the mA and/or kV  according to patient size  and/or use of iterative reconstruction technique. COMPARISON:  None. FINDINGS: Alignment: Straightening of usual cervical lordosis without anterior subluxation. This is likely positional but could indicate muscle spasm. Skull base and vertebrae: Skull base appears intact. No vertebral compression deformities. Enlarged neural foramen at C4 on the right likely indicates a perineural cyst or root sleeve diverticulum. No focal bone lesion or bone destruction. C1-2 articulation appears intact. Soft tissues and spinal canal: No prevertebral soft tissue swelling. No abnormal paraspinal soft tissue mass or infiltration. Enteric and endotracheal tubes are present. Disc levels:  Intervertebral disc space heights are normal. Upper chest: Consolidation or atelectasis in the right posteromedial upper lung. Other: None. IMPRESSION: 1. Nonspecific straightening of usual cervical lordosis. 2. No acute displaced fractures identified. 3. Bone remodeling at the right neural foramen at C4 likely indicating perineural cyst or recently diverticulum. 4. Consolidation or atelectasis in the right upper lung. Electronically Signed   By: Lucienne Capers M.D.   On: 09/08/2021 22:14   DG CHEST PORT 1 VIEW  Result Date: 09/06/21 CLINICAL DATA:  Endotracheal tube present.  Respiratory failure. EXAM: PORTABLE CHEST 1 VIEW COMPARISON:  Prior today FINDINGS: Endotracheal tube has been advanced, with tip now proximally 2 cm above the carina. Nasogastric tube is again seen with tip in proximal stomach. Feeding tube also remains in place although the distal tip is not visualized on this exam. A left jugular central venous catheter remains in appropriate position. No evidence of pneumothorax. Low lung volumes are again seen with subsegmental atelectasis in both lung bases, without significant change. IMPRESSION: Endotracheal tube tip now approximately 2 cm above the carina. No significant change in low lung volumes and  bibasilar subsegmental atelectasis. Electronically Signed   By: Marlaine Hind M.D.   On: 09-06-2021 11:27   DG CHEST PORT 1 VIEW  Result Date: September 06, 2021 CLINICAL DATA:  Central line placement. EXAM: PORTABLE CHEST 1 VIEW COMPARISON:  September 02, 2021. FINDINGS: The heart size and mediastinal contours are within normal limits. Endotracheal and feeding and nasogastric tubes are in grossly good position. Left internal jugular catheter is noted with distal tip in expected position of the SVC. Hypoinflation of the lungs is noted with bibasilar atelectasis and small pleural effusions. No pneumothorax is noted. The visualized skeletal structures are unremarkable. IMPRESSION: Interval placement of left internal jugular catheter with distal tip in expected position of the SVC. Otherwise stable support apparatus. Hypoinflation of the lungs with bibasilar atelectasis and pleural effusions. Electronically Signed   By: Marijo Conception M.D.   On: 06-Sep-2021 09:46   Portable Chest x-ray  Result Date: 09/02/2021 CLINICAL DATA:  Endotracheal tube placement EXAM: PORTABLE CHEST 1 VIEW COMPARISON:  09/01/2021 FINDINGS: Endotracheal tube placed with tip measuring 4.3 cm above the carina. An enteric tube has been placed. Tip is off the field of view but below the left hemidiaphragm. Heart size is normal. Shallow inspiration. Infiltration or atelectasis in both lung bases appears to be increasing since earlier examination, possibly due to decreased inspiratory effort. Probable small right pleural effusion. No pneumothorax. Gaseous distention of the stomach. IMPRESSION: Appliances appear in satisfactory position. Shallow inspiration with infiltration or atelectasis in both lung bases. Probable small right pleural effusion. Electronically Signed   By: Lucienne Capers M.D.   On: 09/02/2021 17:34   Portable Chest x-ray  Result Date: 09/01/2021 CLINICAL DATA:  Intubated EXAM: PORTABLE CHEST 1 VIEW COMPARISON:  08/29/2021 FINDINGS:  Endotracheal tube tip is about 2.8 cm superior to the carina. Esophageal tube  tip below the diaphragm but incompletely visualized. Interval opacity at the left lung base. Suspect fluid level in the right lower chest. No visible pneumothorax. Stable cardiomediastinal silhouette. Increased vascular congestion and development of hazy perihilar opacity likely edema. IMPRESSION: 1. Endotracheal tube tip about 2.8 cm superior to carina 2. Increased bilateral effusions airspace disease at the lung bases. Increased vascular congestion with development of hazy perihilar opacity likely due to edema. Electronically Signed   By: Donavan Foil M.D.   On: 09/01/2021 16:58   DG Chest Portable 1 View  Result Date: 09/18/2021 CLINICAL DATA:  Altered mental status EXAM: PORTABLE CHEST 1 VIEW COMPARISON:  8:57 p.m. FINDINGS: Endotracheal tube is seen with its tip at the level of clavicular head 6.7 cm above the carina. Nasogastric tube extends into the proximal body of the stomach. Lung volumes are small with mild elevation of the right hemidiaphragm. Small right pleural effusion is difficult to exclude. Lungs are otherwise clear. No pneumothorax. Cardiac size within normal limits. Pulmonary vascularity is normal. No acute bone abnormality. IMPRESSION: Stable support tubes. Possible small right pleural effusion. Electronically Signed   By: Fidela Salisbury M.D.   On: 09/13/2021 21:07   DG Abd Portable 1V  Result Date: 09/12/2021 CLINICAL DATA:  51 year old male with history of ileus. EXAM: PORTABLE ABDOMEN - 1 VIEW COMPARISON:  Abdominal radiograph 09/01/2021. FINDINGS: Tip of feeding tube is in the antral pre-pyloric region of the stomach. Diffuse gaseous distension throughout the small bowel and colon. No pneumoperitoneum. IMPRESSION: 1. Diffuse gaseous distension throughout the small bowel and colon, which could be indicative of ileus or distal colonic obstruction. 2. Tip of feeding tube is in the antral pre-pyloric region of  the stomach. Electronically Signed   By: Vinnie Langton M.D.   On: 2021/09/12 06:53   DG Abd Portable 1V  Result Date: 09/01/2021 CLINICAL DATA:  Feeding tube EXAM: PORTABLE ABDOMEN - 1 VIEW COMPARISON:  None. FINDINGS: Enteric tube tip overlies the distal stomach. Upper gas pattern is unremarkable IMPRESSION: Enteric tube tip overlies the distal stomach Electronically Signed   By: Donavan Foil M.D.   On: 09/01/2021 16:48   US Abdomen Limited RUQ (LIVER/GB)  Result Date: 08/31/2021 CLINICAL DATA:  Cirrhosis EXAM: ULTRASOUND ABDOMEN LIMITED RIGHT UPPER QUADRANT COMPARISON:  None. FINDINGS: Gallbladder: Gallbladder is contracted. No visible stones or pericholecystic fluid. Common bile duct: Diameter: Normal caliber, 5-6 mm. Liver: Heterogeneous echotexture with nodular contours. No focal hepatic abnormality. Portal vein is patent on color Doppler imaging with normal direction of blood flow towards the liver. Other: Mild perihepatic ascites.  Small right pleural effusion. IMPRESSION: Changes of cirrhosis. Perihepatic ascites.  Small right effusion. Electronically Signed   By: Rolm Baptise M.D.   On: 08/31/2021 01:24    Microbiology Recent Results (from the past 240 hour(s))  Resp Panel by RT-PCR (Flu A&B, Covid) Nasopharyngeal Swab     Status: None   Collection Time: 09/18/2021  8:42 PM   Specimen: Nasopharyngeal Swab; Nasopharyngeal(NP) swabs in vial transport medium  Result Value Ref Range Status   SARS Coronavirus 2 by RT PCR NEGATIVE NEGATIVE Final    Comment: (NOTE) SARS-CoV-2 target nucleic acids are NOT DETECTED.  The SARS-CoV-2 RNA is generally detectable in upper respiratory specimens during the acute phase of infection. The lowest concentration of SARS-CoV-2 viral copies this assay can detect is 138 copies/mL. A negative result does not preclude SARS-Cov-2 infection and should not be used as the sole basis for treatment or other patient management  decisions. A negative result may occur  with  improper specimen collection/handling, submission of specimen other than nasopharyngeal swab, presence of viral mutation(s) within the areas targeted by this assay, and inadequate number of viral copies(<138 copies/mL). A negative result must be combined with clinical observations, patient history, and epidemiological information. The expected result is Negative.  Fact Sheet for Patients:  EntrepreneurPulse.com.au  Fact Sheet for Healthcare Providers:  IncredibleEmployment.be  This test is no t yet approved or cleared by the [place] FDA and  has been authorized for detection and/or diagnosis of SARS-CoV-2 by FDA under an Emergency Use Authorization (EUA). This EUA will remain  in effect (meaning this test can be used) for the duration of the COVID-19 declaration under Section 564(b)(1) of the Act, 21 U.S.C.section 360bbb-3(b)(1), unless the authorization is terminated  or revoked sooner.       Influenza A by PCR NEGATIVE NEGATIVE Final   Influenza B by PCR NEGATIVE NEGATIVE Final    Comment: (NOTE) The Xpert Xpress SARS-CoV-2/FLU/RSV plus assay is intended as an aid in the diagnosis of influenza from Nasopharyngeal swab specimens and should not be used as a sole basis for treatment. Nasal washings and aspirates are unacceptable for Xpert Xpress SARS-CoV-2/FLU/RSV testing.  Fact Sheet for Patients: EntrepreneurPulse.com.au  Fact Sheet for Healthcare Providers: IncredibleEmployment.be  This test is not yet approved or cleared by the [place] FDA and has been authorized for detection and/or diagnosis of SARS-CoV-2 by FDA under an Emergency Use Authorization (EUA). This EUA will remain in effect (meaning this test can be used) for the duration of the COVID-19 declaration under Section 564(b)(1) of the Act, 21 U.S.C. section 360bbb-3(b)(1), unless the authorization is terminated  or revoked.  Performed at [facility], [place] [address], [place], [place] [address]   MRSA Next Gen by PCR, Nasal     Status: Abnormal   Collection Time: 08/31/21  8:09 AM   Specimen: Nasal Mucosa; Nasal Swab  Result Value Ref Range Status   MRSA by PCR Next Gen DETECTED (A) NOT DETECTED Final    Comment: RESULT CALLED TO, READ BACK BY AND VERIFIED WITH: RN Georges Mouse [id number] AT 48 AM BY CM (NOTE) The GeneXpert MRSA Assay (FDA approved for NASAL specimens only), is one component of a comprehensive MRSA colonization surveillance program. It is not intended to diagnose MRSA infection nor to guide or monitor treatment for MRSA infections. Test performance is not FDA approved in patients less than 97 years old. Performed at [facility], [place] [address] [place], [place] [address]   Culture, Respiratory w Gram Stain     Status: None (Preliminary result)   Collection Time: 09/02/21  4:00 PM   Specimen: Endotracheal; Respiratory  Result Value Ref Range Status   Specimen Description ENDOTRACHEAL  Final   Special Requests NONE  Final   Gram Stain   Final    NO SQUAMOUS EPITHELIAL CELLS SEEN ABUNDANT WBC SEEN NO ORGANISMS SEEN    Culture   Final    NO GROWTH < 24 HOURS Performed at [facility], [place] [address] [place], [place] [address]    Report Status PENDING  Incomplete    Lab Basic Metabolic Panel: Recent Labs  Lab 09/01/21 0626 09/01/21 1519 09/01/21 1836 09/02/21 0426 09/02/21 1708 09/02/21 2017 09/21/2021 0441 09/21/2021 0940 September 21, 2021 1038  NA 138   < > 141 144  --   --  148* 156* 150*  K 3.2*   < >  4.0 3.4*  --   --  3.4* 2.7* 2.9*  CL 108  --  111 116*  --   --  119*  --  118*  CO2 20*  --  19* 19*  --   --  18*  --  16*  GLUCOSE 172*  --  192* 245*  --   --  151*  --  117*  BUN 13  --  14 17  --   --  22*  --  23*  CREATININE 0.94  --  1.16 1.30*  --   --  1.85*  --  2.42*  CALCIUM 7.7*  --  8.2* 8.3*  --   --  8.3*  --  7.7*  MG 1.1*  --  1.5*  2.4 2.3  --  2.2  --   --   PHOS 1.7*  --  2.1* 1.6* 4.8* 4.0 3.3  --   --    < > = values in this interval not displayed.   Liver Function Tests: Recent Labs  Lab 08/31/21 0405 09/01/21 0626 09/02/21 0426 September 29, 2021 0441 2021-09-29 1038  AST 71* 61* 54* 53* 61*  ALT 32 23 20 23 19   ALKPHOS 163* 111 90 113 188*  BILITOT 2.2* 5.0* 4.4* 5.3* 5.3*  PROT 5.9* 5.8* 6.1* 6.3* 5.3*  ALBUMIN 1.9* 2.2* 2.9* 2.8* 2.4*   No results for input(s): LIPASE, AMYLASE in the last 168 hours. Recent Labs  Lab 08/31/21 0405  AMMONIA 89*   CBC: Recent Labs  Lab 09/19/2021 2045 09/21/2021 2103 08/31/21 0852 09/01/21 0626 09/01/21 1519 09/01/21 1645 09/02/21 0426 09/29/2021 0441 09-29-21 0940 Sep 29, 2021 1038  WBC 6.3   < > 6.5 6.3  --   --  4.6 4.4  --  2.2*  NEUTROABS 4.2  --   --   --   --   --   --   --   --   --   HGB 10.2*   < > 8.0* 7.9*   < > 8.5* 7.7* 8.1* 7.1* 9.5*  HCT 29.4*   < > 24.1* 23.1*   < > 25.0* 22.9* 25.8* 21.0* 30.7*  MCV 92.7   < > 94.5 93.1  --   --  93.9 98.5  --  102.0*  PLT 103*   < > 50* 45*  --   --  46* 69*  --  69*   < > = values in this interval not displayed.   Cardiac Enzymes: Recent Labs  Lab 09/14/2021 2045 08/31/21 0405  CKTOTAL 431* 275   Sepsis Labs: Recent Labs  Lab 08/31/21 0405 08/31/21 0453 08/31/21 0852 09/01/21 0626 09/01/21 1215 09/02/21 0426 09/02/21 1009 Sep 29, 2021 0441 09-29-2021 1038  WBC  --    < > 6.5 6.3  --  4.6  --  4.4 2.2*  LATICACIDVEN 4.9*  --  5.7*  --  2.2*  --  2.4*  --   --    < > = values in this interval not displayed.    Procedures/Operations  2/7   Intubated 2/8   Extubated, apneic episodes reintubated 2/9   Self-extubated then reintubated 2/10 Central line catheter, A-line, ETT exchanged, EGD   Monea Pesantez M. Mackenize Delgadillo, D.O.  Internal Medicine Resident, PGY-1 [facility]  Pager: [phone] 4:49 PM, Sep 29, 2021   **Please contact the on call pager after 5 pm and on weekends at  [phone].**

## 2021-09-22 DEATH — deceased

## 2022-07-22 IMAGING — DX DG ABD PORTABLE 1V
1 series · 2 of 2 positions shown · non-contrast
Comparison: Abdominal radiograph 09/01/2021.

CLINICAL DATA: 50-year-old male with history of ileus.

EXAM:
PORTABLE ABDOMEN - 1 VIEW

[Series 1: abdomen · 0.14mm/px · 2 of 2 slices shown]
[im 1/2]
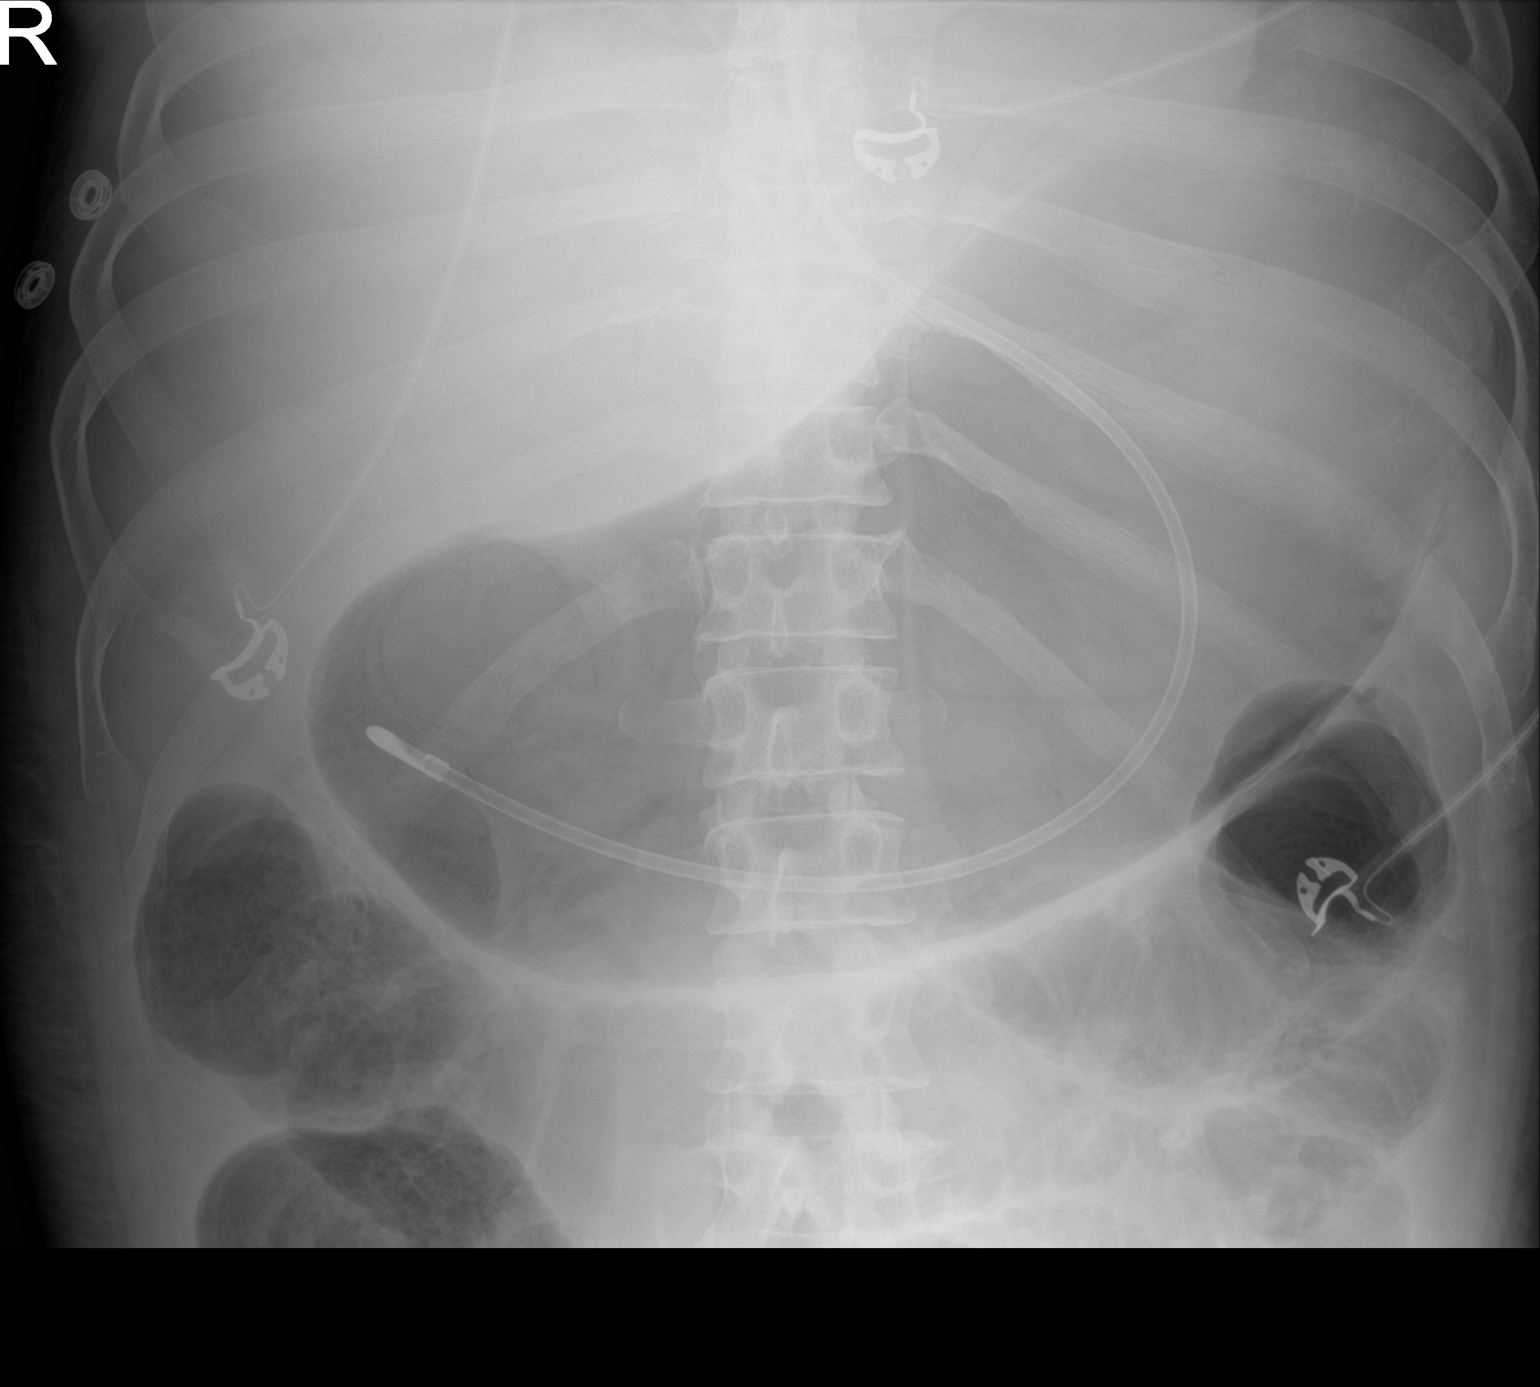
[im 2/2]
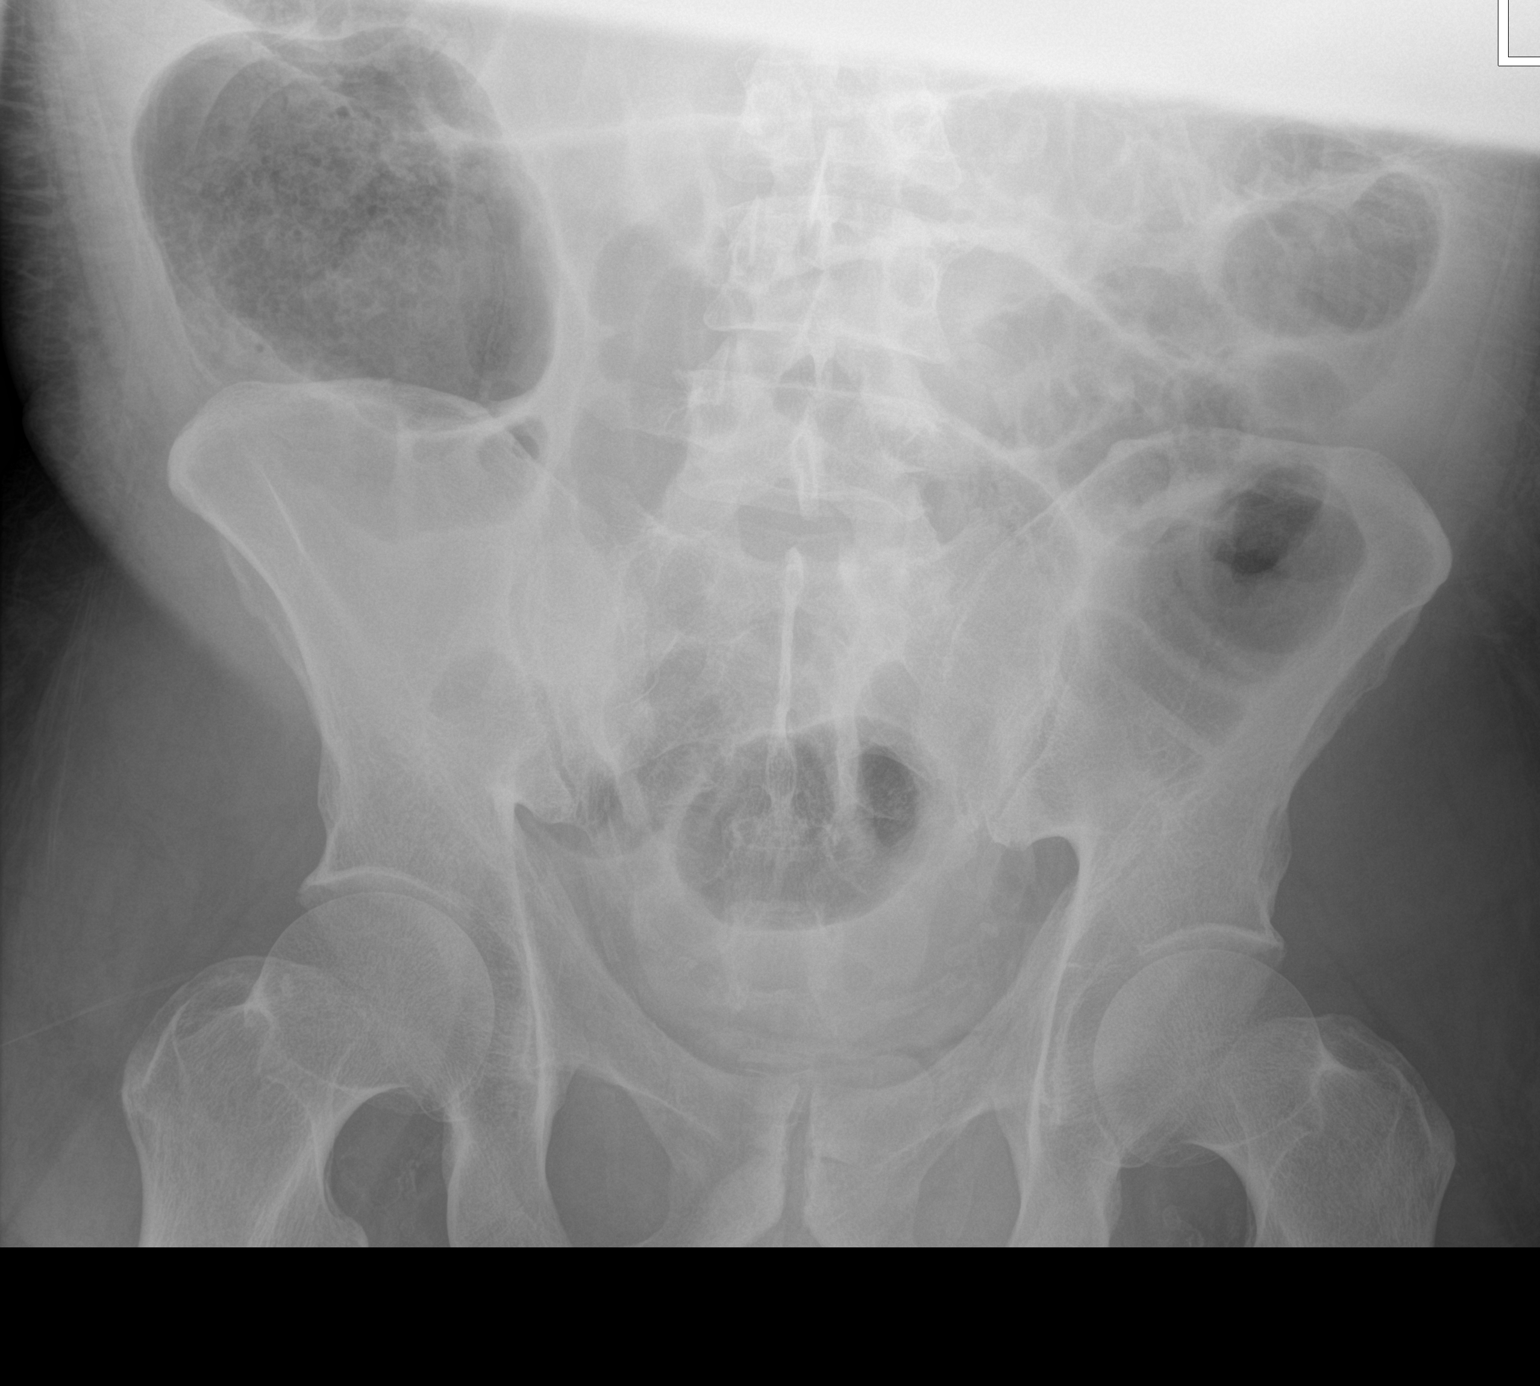

[2 of 2 positions shown; findings below may reference images not displayed]

FINDINGS: Tip of feeding tube is in the antral pre-pyloric region of the
stomach. Diffuse gaseous distension throughout the small bowel and
colon. No pneumoperitoneum.
IMPRESSION: 1. Diffuse gaseous distension throughout the small bowel and colon,
which could be indicative of ileus or distal colonic obstruction.
2. Tip of feeding tube is in the antral pre-pyloric region of the
stomach.
